# Patient Record
Sex: Female | Born: 1970 | Race: White | Hispanic: No | Marital: Married | Smoking: Former smoker
Health system: Southern US, Community
[De-identification: ages and names within clinical notes are randomized; demographics above are authoritative.]

## PROBLEM LIST (undated history)

## (undated) DIAGNOSIS — F419 Anxiety disorder, unspecified: Secondary | ICD-10-CM

## (undated) DIAGNOSIS — M1990 Unspecified osteoarthritis, unspecified site: Secondary | ICD-10-CM

## (undated) DIAGNOSIS — N83202 Unspecified ovarian cyst, left side: Secondary | ICD-10-CM

## (undated) DIAGNOSIS — L309 Dermatitis, unspecified: Secondary | ICD-10-CM

## (undated) DIAGNOSIS — N62 Hypertrophy of breast: Secondary | ICD-10-CM

## (undated) DIAGNOSIS — M549 Dorsalgia, unspecified: Secondary | ICD-10-CM

## (undated) DIAGNOSIS — E039 Hypothyroidism, unspecified: Secondary | ICD-10-CM

## (undated) DIAGNOSIS — S0300XA Dislocation of jaw, unspecified side, initial encounter: Secondary | ICD-10-CM

## (undated) DIAGNOSIS — J302 Other seasonal allergic rhinitis: Secondary | ICD-10-CM

## (undated) DIAGNOSIS — J189 Pneumonia, unspecified organism: Secondary | ICD-10-CM

## (undated) HISTORY — PX: TEMPOROMANDIBULAR JOINT SURGERY: SHX35

## (undated) HISTORY — PX: REDUCTION MAMMAPLASTY: SUR839

---

## 2001-04-23 ENCOUNTER — Encounter: Admission: RE | Admit: 2001-04-23 | Discharge: 2001-04-23 | Payer: Self-pay | Admitting: Vascular Surgery

## 2001-04-23 ENCOUNTER — Encounter: Payer: Self-pay | Admitting: Vascular Surgery

## 2001-08-06 HISTORY — PX: HEMORROIDECTOMY: SUR656

## 2002-12-02 ENCOUNTER — Other Ambulatory Visit: Admission: RE | Admit: 2002-12-02 | Discharge: 2002-12-02 | Payer: Self-pay | Admitting: Obstetrics & Gynecology

## 2002-12-22 ENCOUNTER — Encounter: Payer: Self-pay | Admitting: Gastroenterology

## 2002-12-22 ENCOUNTER — Encounter: Admission: RE | Admit: 2002-12-22 | Discharge: 2002-12-22 | Payer: Self-pay | Admitting: Gastroenterology

## 2002-12-28 ENCOUNTER — Encounter: Admission: RE | Admit: 2002-12-28 | Discharge: 2002-12-28 | Payer: Self-pay | Admitting: Gastroenterology

## 2002-12-28 ENCOUNTER — Encounter: Payer: Self-pay | Admitting: Gastroenterology

## 2003-01-11 ENCOUNTER — Encounter: Payer: Self-pay | Admitting: Gastroenterology

## 2003-01-11 ENCOUNTER — Encounter: Admission: RE | Admit: 2003-01-11 | Discharge: 2003-01-11 | Payer: Self-pay | Admitting: Gastroenterology

## 2003-02-05 ENCOUNTER — Ambulatory Visit (HOSPITAL_COMMUNITY): Admission: RE | Admit: 2003-02-05 | Discharge: 2003-02-05 | Payer: Self-pay | Admitting: Gastroenterology

## 2003-02-18 ENCOUNTER — Inpatient Hospital Stay (HOSPITAL_COMMUNITY): Admission: RE | Admit: 2003-02-18 | Discharge: 2003-02-20 | Payer: Self-pay | Admitting: Obstetrics & Gynecology

## 2003-02-18 ENCOUNTER — Encounter (INDEPENDENT_AMBULATORY_CARE_PROVIDER_SITE_OTHER): Payer: Self-pay | Admitting: *Deleted

## 2003-02-18 HISTORY — PX: ABDOMINAL HYSTERECTOMY: SHX81

## 2007-04-11 ENCOUNTER — Encounter: Admission: RE | Admit: 2007-04-11 | Discharge: 2007-04-11 | Payer: Self-pay | Admitting: Obstetrics & Gynecology

## 2007-04-28 ENCOUNTER — Encounter: Admission: RE | Admit: 2007-04-28 | Discharge: 2007-04-28 | Payer: Self-pay | Admitting: General Surgery

## 2007-05-08 ENCOUNTER — Encounter: Admission: RE | Admit: 2007-05-08 | Discharge: 2007-05-08 | Payer: Self-pay | Admitting: General Surgery

## 2007-05-30 ENCOUNTER — Ambulatory Visit (HOSPITAL_BASED_OUTPATIENT_CLINIC_OR_DEPARTMENT_OTHER): Admission: RE | Admit: 2007-05-30 | Discharge: 2007-05-30 | Payer: Self-pay | Admitting: General Surgery

## 2007-05-30 HISTORY — PX: BREAST LUMPECTOMY: SHX2

## 2007-09-19 ENCOUNTER — Encounter: Admission: RE | Admit: 2007-09-19 | Discharge: 2007-09-19 | Payer: Self-pay | Admitting: General Surgery

## 2008-03-08 ENCOUNTER — Encounter: Admission: RE | Admit: 2008-03-08 | Discharge: 2008-03-08 | Payer: Self-pay | Admitting: General Surgery

## 2009-06-24 IMAGING — MG MM DUCTOGRAM UNILATERAL *L*
3 series · 3 of 3 positions shown · non-contrast
Comparison: none

Addendum Begins
Addendum
CLINICAL DATA: Right nipple discharge.

On physical exam, today, I elicit milky and green discharge from four separate ducts on the right 
nipple.  This is not suspicious for a malignant breast process and ductography is not indicated.
CLINICAL DATA: Spontaneous left nipple discharge.

[L CC]
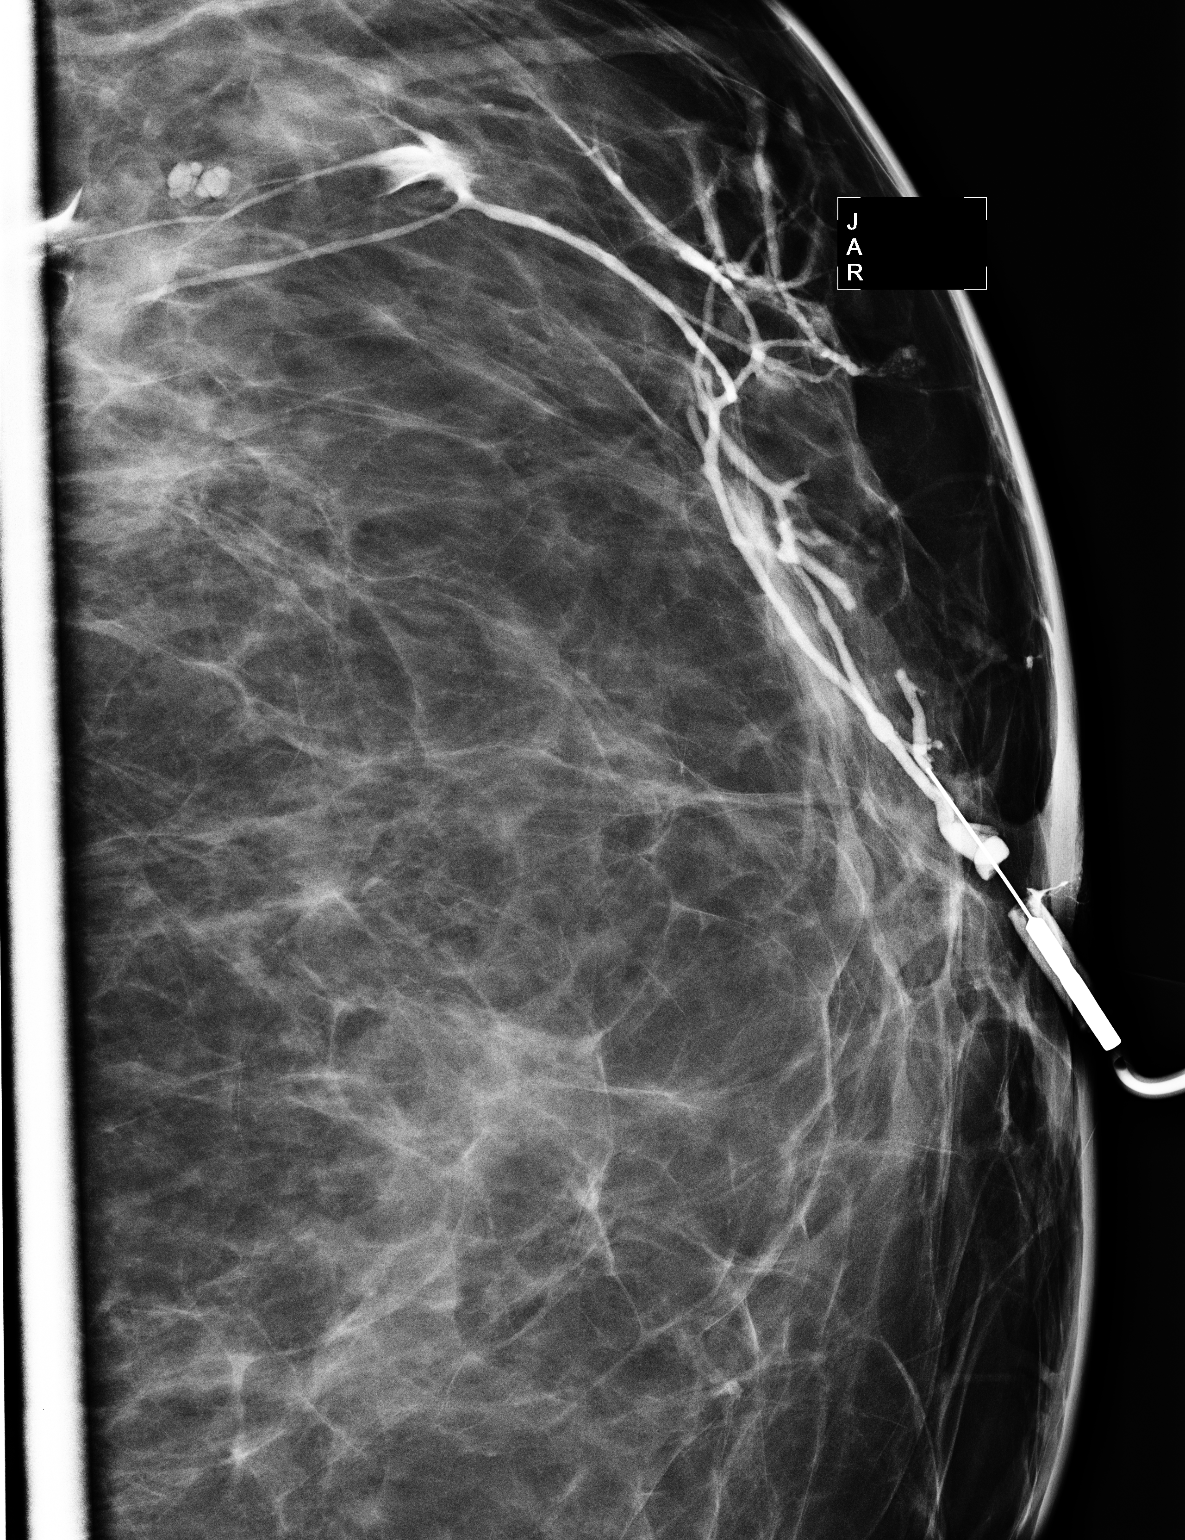

[L ML (1 of 2)]
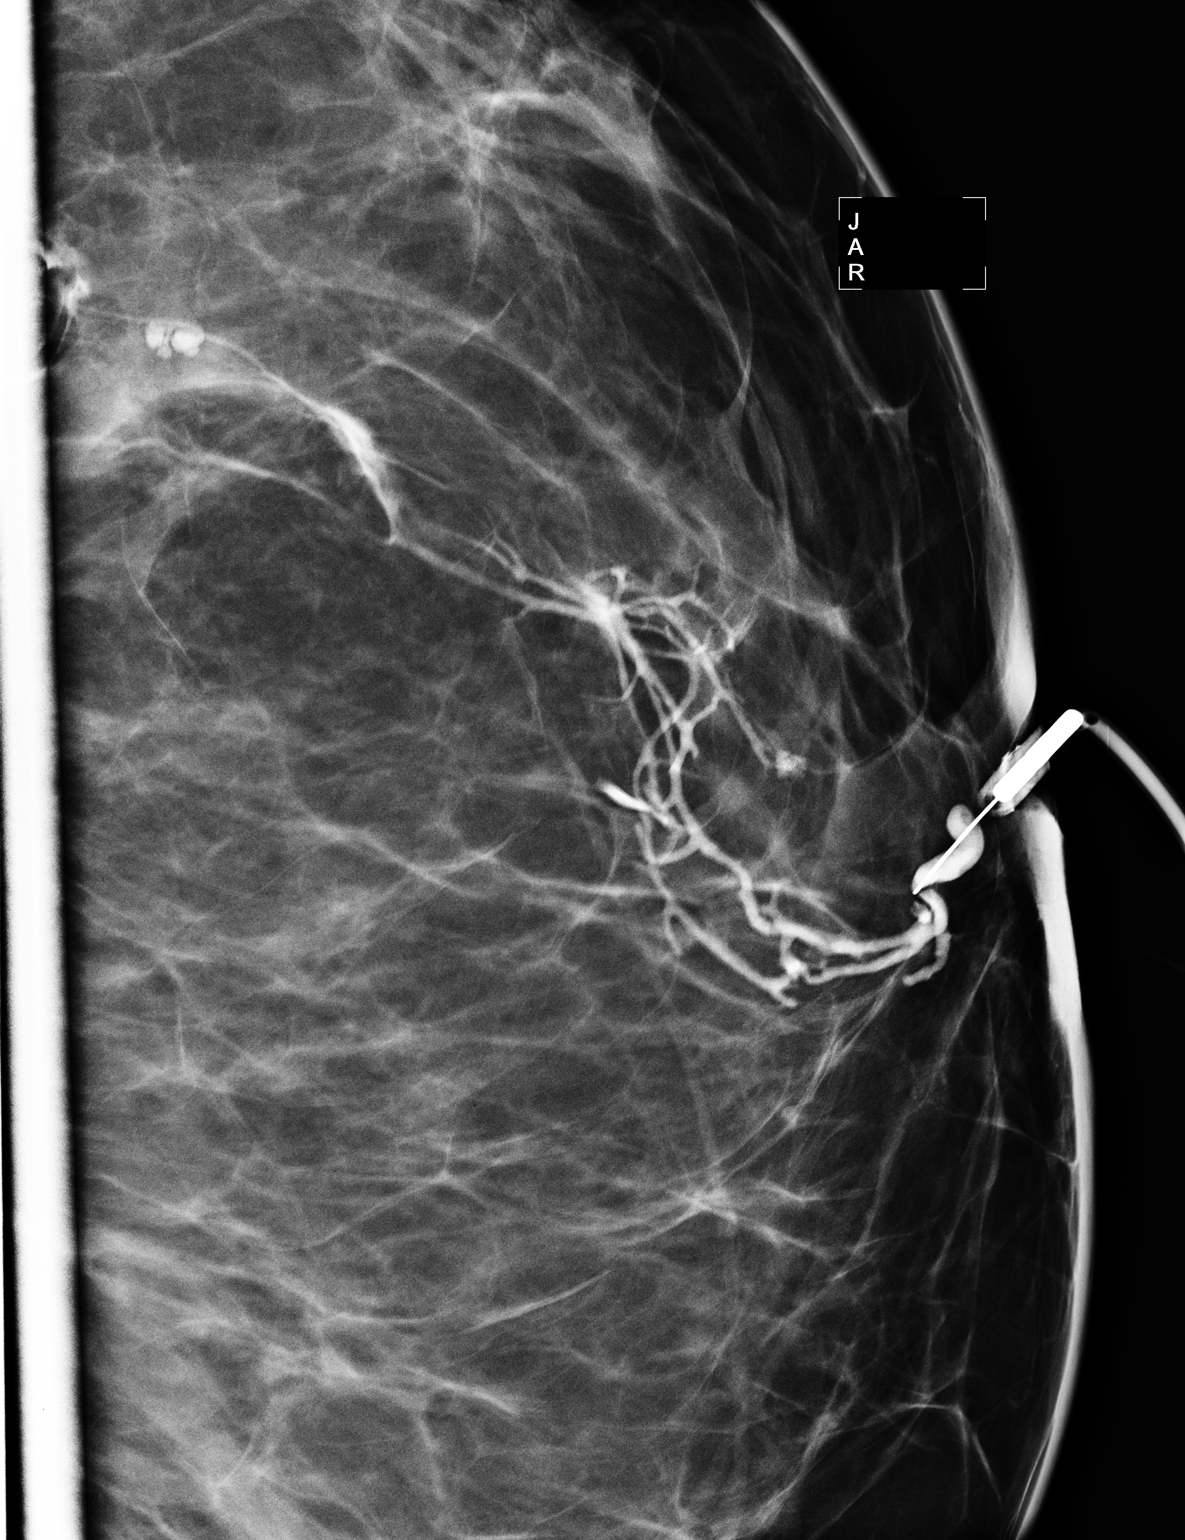

[L ML (2 of 2)]
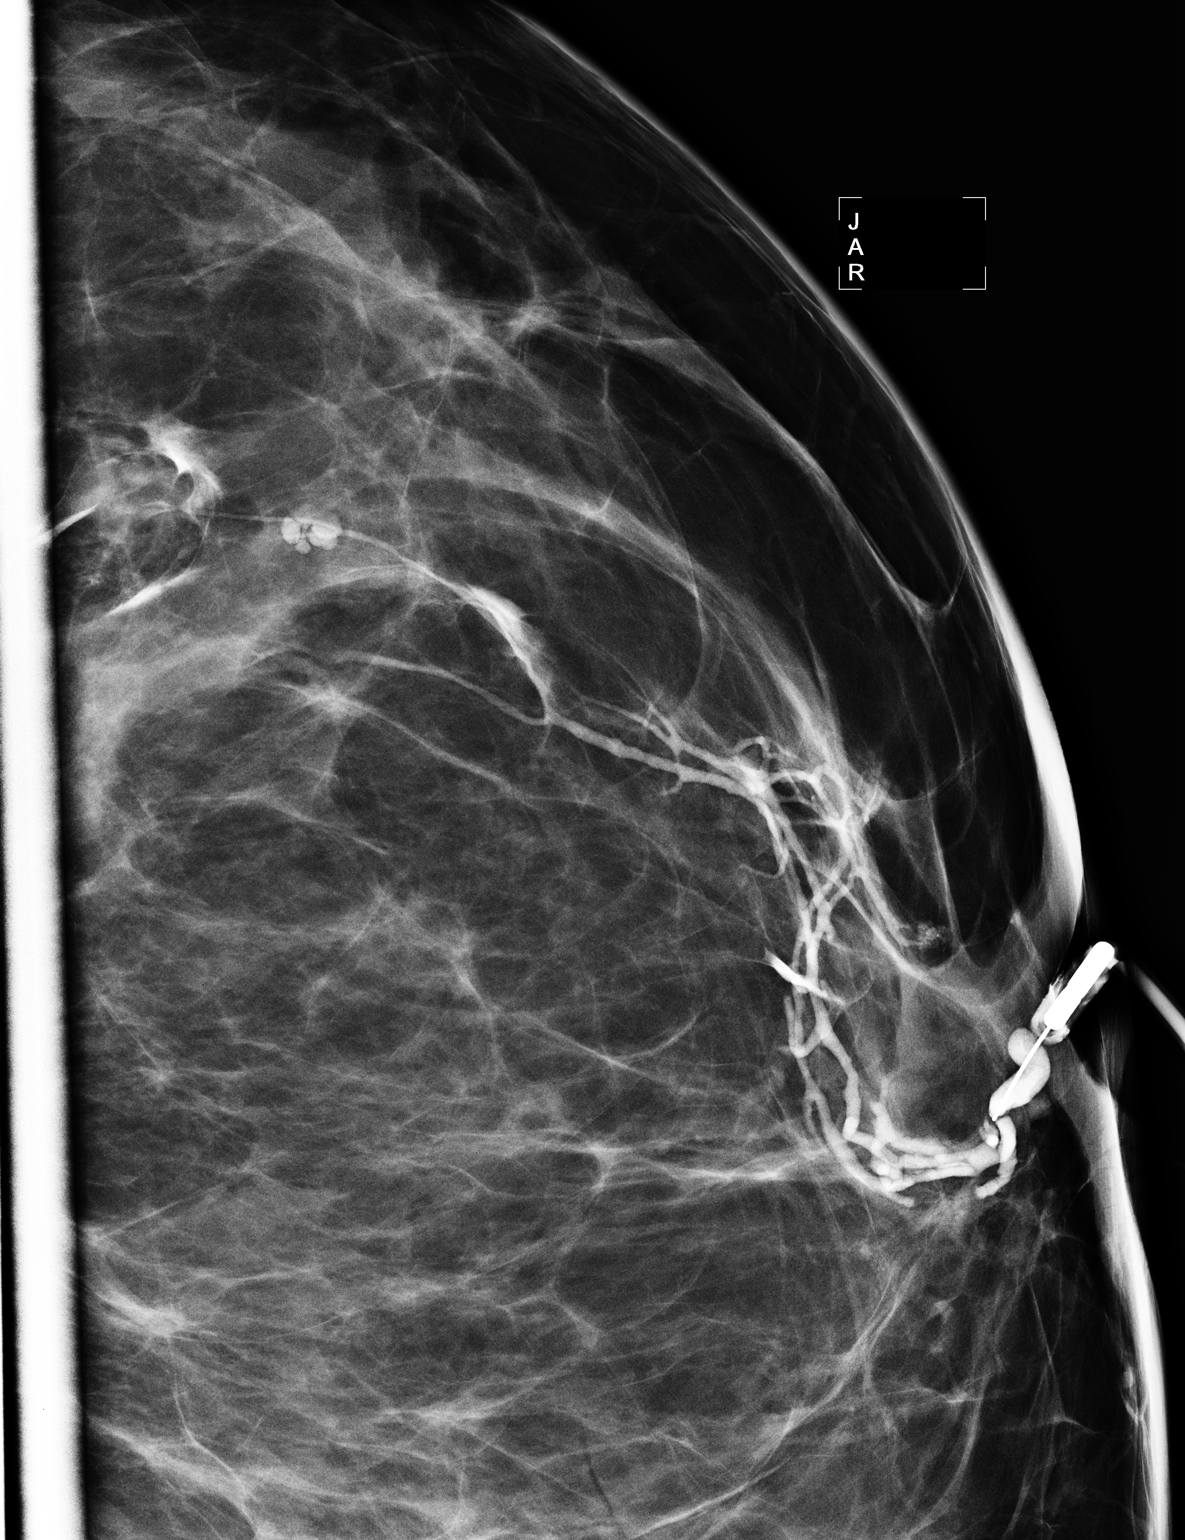

[3 of 3 positions shown; findings below may reference images not displayed]

IMPRESSION: Physiologic nipple discharge on the right.  Follow-up left mammogram in five months will be 
performed as previously recommended.

ASSESSMENT:  Probably benign - BI-RADS 3
, THIS PROCEDURE WAS A DIGITAL MAMMOGRAM.
Addendum Ends
DUCTOGRAM UNILATERAL *L*
CC and MLO view(s) were taken of the left breast.

LEFT BREAST DUCTOGRAM:
A small amount of brownish discharge could be expressed from a single duct on the left.  The nipple
was cleansed with alcohol and the duct was cannulated with a sialogram catheter.  Contrast 
material was instilled.  There is a truncated duct in the subareolar region concerning for a 
papilloma.  There is also evidence of a filling defect directly underneath the  nipple that is also
worrisome for a papilloma.
IMPRESSION: Two intraductal abnormalities concerning for papillomata.  Surgical excision is recommended. The 
patient has a surgical consult with Dr. Maagda Isidor on 04-29-07.

ASSESSMENT: Suspicious - BI-RADS 4

Surgical consultation of the left breast.
, THIS PROCEDURE WAS A DIGITAL MAMMOGRAM.

## 2010-06-06 ENCOUNTER — Encounter: Admission: RE | Admit: 2010-06-06 | Discharge: 2010-06-06 | Payer: Self-pay | Admitting: Obstetrics & Gynecology

## 2010-06-09 ENCOUNTER — Encounter: Admission: RE | Admit: 2010-06-09 | Discharge: 2010-06-09 | Payer: Self-pay | Admitting: Obstetrics & Gynecology

## 2010-08-01 ENCOUNTER — Encounter
Admission: RE | Admit: 2010-08-01 | Discharge: 2010-08-01 | Payer: Self-pay | Source: Home / Self Care | Attending: General Surgery | Admitting: General Surgery

## 2010-08-01 ENCOUNTER — Ambulatory Visit
Admission: RE | Admit: 2010-08-01 | Discharge: 2010-08-01 | Payer: Self-pay | Source: Home / Self Care | Attending: General Surgery | Admitting: General Surgery

## 2010-08-01 HISTORY — PX: BREAST BIOPSY: SHX20

## 2010-08-06 HISTORY — PX: OTHER SURGICAL HISTORY: SHX169

## 2010-08-27 ENCOUNTER — Encounter: Payer: Self-pay | Admitting: General Surgery

## 2010-10-16 LAB — POCT HEMOGLOBIN-HEMACUE: Hemoglobin: 13.8 g/dL (ref 12.0–15.0)

## 2010-12-19 NOTE — Op Note (Signed)
Suzanne Trujillo, MOCCIO             ACCOUNT NO.:  [id number]   MEDICAL RECORD NO.:  [id number]          PATIENT TYPE:  AMB   LOCATION:  DSC                          FACILITY:  [facility]   PHYSICIAN:  Lennie Muckle, MD      DATE OF BIRTH:  06/28/1971   DATE OF PROCEDURE:  05/30/2007  DATE OF DISCHARGE:                               OPERATIVE REPORT   PROCEDURE:  Excision, left breast papilloma.   PREOPERATIVE DIAGNOSIS:  Left breast papilloma.   POSTOPERATIVE DIAGNOSIS:  Left breast papilloma.   SURGEON:  Lennie Muckle, MD   ASSISTANT:  Wilmon Arms. Tsuei, M.D.   INDICATIONS FOR PROCEDURE:  Ms. Flaim is a 40 year old female who had  drainage of brownish fluid from her left breast, was found to have a  filling defect which is consistent with a papilloma.  A ductogram was  performed.  The filling defect was confirmed on a ductogram.  The  informed consent was obtained prior to procedure.   DESCRIPTION OF PROCEDURE:  Ms. Binz was identified in the preoperative  holding suite.  She did receive preoperative antibiotics of gentamicin.  The appropriate side of the body was marked.  She was taken to the  operating suite where she was placed in a supine position.  After  administration of a general endotracheal anesthesia, at time out was  performed to identify the patient and the procedure.  Her left breast  was prepped and draped in the usual sterile fashion.  Upon review of the  ductogram, the lesion was believed to be inferior and lateral on the  left breast.  Prior to incision, the area was attempted to be dilated  with a small probe.  An Angiocath was introduced into the area that was  believed to be the duct and approximately 2 mL of methylene blue was  injected into the breast tissue. A lateral incision was made in the  periareolar area.  Electrocautery was used to dissect just beneath the  nipple.  After incision, the methylene blue was identified just beneath  the areola, however,  it did not track into a clear ductal system.  Based  on the films, I then performed excision of the tissue which was lateral  and inferior to the nipple areolar complex.  I did go approximately 6 cm  in depth into the tissues with excision of the breast tissue.  The  anterior portion is marked with a double suture and lateral with a long  lateral suture and the short single suture marked the superior portion  of the margin of breast tissue.  The area was irrigated.  Hemostasis  maintained with electrocautery.  The wound bed was then closed with 3-0  Vicryl suture and 4-0 Monocryl, 25 mL of 0.25% Marcaine with epinephrine  1% lidocaine were injected into the tissues to fill and also filled the  defect from excision of the breast tissue.  Steri-Strips and dry gauze  were placed for dressing.  The patient was then wrapped in an Ace wrap  and extubated, transported to post-anesthesia care unit in stable  condition.  Estimated blood loss was minimal, no immediate  complications.  Specimen sent to pathology for review.     Lennie Muckle, MD  Electronically Signed    ALA/MEDQ  D:  05/30/2007  T:  05/30/2007  Job:  [id number]

## 2010-12-22 NOTE — Discharge Summary (Signed)
NAMEBIONCA, Suzanne Trujillo                       ACCOUNT NO.:  [id number]   MEDICAL RECORD NO.:  [id number]                   PATIENT TYPE:  INP   LOCATION:  9327                                 [facility]   PHYSICIAN:  Gerrit Friends. Aldona Bar, M.D.                DATE OF BIRTH:  19-Jul-1971   DATE OF ADMISSION:  02/18/2003  DATE OF DISCHARGE:  02/20/2003                                 DISCHARGE SUMMARY   DISCHARGE DIAGNOSES:  1. 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  2. Mild cervical dysplasia.   PROCEDURE:  Exploratory laparotomy and lysis of adhesions, total abdominal  hysterectomy, right salpingo-oophorectomy.   HISTORY OF PRESENT ILLNESS:  This 40 year old female was admitted for a  total abdominal hysterectomy and at least a right salpingo-oophorectomy with  a history of chronic pelvic abdominal pain felt to be related to either  adhesions or endometriosis.  She had a thorough GI workup including a  colonoscopy and other than a chronic history of irritable bowel syndrome, no  abnormalities were found.  She did have a previous cesarean section and it  was felt that possibly her symptoms were consistent with either  endometriosis/adenomyosis or adhesions.   HOSPITAL COURSE:  Intraoperatively, it was discovered that she had dense  adhesions involving the bladder which was well advanced onto the anterior  surface of the uterus as well as omental adhesions.  The adhesions were  taken down in the hysterectomy and right salpingo-oophorectomy was carried  out without difficulty.  The dissection was somewhat difficult, especially  in the area of the patient's cervix.   Her preoperative hemoglobin was 12.6 with a white count of 5800 and her  postoperative hemoglobin was 11.8 with a white count of 9600.  She remained  afebrile during her postoperative course and had excellent bladder function  likewise during her postoperative course.  She also related the chronic  pain  symptoms were essentially much improved postoperatively and in fact said  that her only discomfort was that associated with the actual surgical  procedure and was nowhere in comparison to the pain that she was having  preoperatively.   On the afternoon of July 17, having had normal bowel and bladder functions,  and having been afebrile and being that she was tolerating her regular diet  well, she was offered discharge and accepted.  At the time of discharge her  staples were clean and dry and her staples were left in and she will return  to the office on Monday, July 19, for staple removal and Steri-Stripping of  her incision.  She was given all careful instructions at the time of  discharge and understood all instructions well.   DISCHARGE MEDICATIONS:  1. Tylox 1-2 every 4-6 hours as needed for pain.  2. Motrin 600 mg every six hours as needed for pain.  3. Zelnorm 6 mg twice daily.  4. She  will use over the counter medications as needed for constipation     and/or gas.   FOLLOW UP:  Arranged as above for staple removal.   CONDITION ON DISCHARGE:  Improved.   Her pathologic report did reveal as mentioned no evidence of adenomyosis or  endometriosis.  There was some mild dysplasia of her cervix noted, although  her previous Pap smear was reported as normal.  In summary I would assume  that the taking down of the bladder off the anterior wall of the  uterus/adhesiolysis may have indeed resolved her chronic pain issues.                                                 Gerrit Friends. Aldona Bar, M.D.    RMW/MEDQ  D:  02/20/2003  T:  02/21/2003  Job:  [id number]   cc:   Gerrit Friends. Aldona Bar, M.D.  [address]  [place]  [place] [address]  Fax: [phone]   Graylin Shiver, M.D.  [phone]  [address]  [place], [place] [address]  Fax: [phone]

## 2010-12-22 NOTE — H&P (Signed)
[name], [name]                       ACCOUNT NO.:  [id number]   MEDICAL RECORD NO.:  [id number]                   PATIENT TYPE:  INP   LOCATION:  NA                                   [facility]   PHYSICIAN:  Gerrit Friends. Aldona Bar, M.D.                DATE OF BIRTH:  01-24-1971   DATE OF ADMISSION:  02/18/2003  DATE OF DISCHARGE:                                HISTORY & PHYSICAL   HISTORY OF PRESENT ILLNESS:  Suzanne Trujillo is a gravida 4, para 2, aborta  6, 40 year old married white female, with a history of chronic low abdominal  pain, pelvic pressure, chronic right lower quadrant pain, who is admitted  for a total abdominal hysterectomy, right salpingo-oophorectomy, possible  lysis of adhesions, possible left salpingo-oophorectomy, and possible  appendectomy.  This patient was seen by me for the first time in late April  of 2004.  She gave a history of irritable bowel syndrome, but a more  significant history of right lower quadrant pain for several months  associated with no period since November of 2003.  She had her two  deliveries by cesarean section.  She had had an ectopic pregnancy in 1994  which was treated with medication.  She has also had one miscarriage.  When  I examined her on April 28, her uterus was tender.  No masses were palpable  and the uterus was felt to be upper limits of normal size.  A trial of oral  contraceptives was carried out, but with no results.  She had no period.  She did undergo lab testing which revealed a normal thyroid and normal blood  count, a normal Pap smear, and ultimately in June of 2004, had a normal FSH  level and a normal CA-125.  Because some of her symptomatology seemed to be  gastrointestinal in etiology as well, she was referred to a  gastroenterologist for a thorough workup, which ultimately included a  colonoscopy.  CAT scan was also done.  Essentially her GI workup was  negative with the exception of her history of  irritable bowel syndrome, and  for that she is currently on Zelnorm 6 mg twice daily.  Her discomfort is  significant enough to require Darvocet-N and in the evenings, Vicodin.   After much discussion with the patient, especially after her GI workup was  completed, a decision was made to proceed with a total abdominal  hysterectomy with the possible expectation of uncovering adhesions or  endometriosis.  Because her ectopic pregnancy was located in her right tube,  and treated with presumably methotrexate, she will also undergo a right  salpingo-oophorectomy, since this is where the predominant amount of her  abdominal discomfort tends to be centralized.  The appendix will also be  inspected carefully and removed if necessary, and likewise, the left tube  and ovary will be inspected carefully and removed if necessary.  I had  a  long talk with the patient and her husband, and both understand the  expectations from the surgery, are relief of her low abdominal discomfort,  but unfortunately, the surgery is not necessarily a guarantee that her  discomfort will be improved upon.   The risks and benefits and expectations of her surgery were discussed and  understood well by the patient and her husband.   PAST MEDICAL HISTORY:   ALLERGIES:  The patient is allergic to PENICILLIN.   PAST SURGICAL HISTORY:  Other than her two cesarean sections, she has had no  other surgery.   OTHER MEDICATIONS:  1. Zoloft 50 mg a day.  2. Zelnorm as mentioned above.  3. P.r.n. Darvocet-N.  4. Usually a Vicodin at bedtime.   SOCIAL HISTORY:  Noncontributory.   FAMILY HISTORY:  Noncontributory.   PHYSICAL EXAMINATION:  GENERAL:  Examination at the time of admission finds  a well-developed female in no acute distress.  VITAL SIGNS:  Temperature 98.2, blood pressure 110/70, weight 154,  respirations 18 and regular, pulse 80 and regular.  HEENT:  Negative.  NECK:  Thyroid not enlarged.  CHEST:  Clear.   CARDIOVASCULAR:  Regular rhythm, no murmur.  BREASTS:  Negative.  ABDOMEN:  There is deep tenderness in the lower abdomen.  There is no  rebound and no organomegaly.  No CVA tenderness.  PELVIC:  External genitalia and BUS normal.  Shortness by digital vault well-  supported.  Cervix:  No gross lesions.  Uterus:  Upper limits of normal  size.  Relatively thick.  Adnexal area:  No obvious masses, although there  is tenderness in the right adnexal area.  Rectovaginal confirmatory.  EXTREMITIES:  Negative.  NEUROLOGIC:  Physiologic.   IMPRESSION:  Chronic lower abdominal and pelvic pain - suspect possible  endometriosis - possible adhesions - no further childbearing.   PLAN:  The patient had a thorough GI workup which was negative, although she  does have a history of irritable bowel syndrome and is on medication for  that.  She will undergo a total abdominal hysterectomy, right salpingo-  oophorectomy, possible left salpingo-oophorectomy, and possible  appendectomy.                                                  Gerrit Friends. Aldona Bar, M.D.    RMW/MEDQ  D:  02/17/2003  T:  02/17/2003  Job:  [id number]

## 2011-05-16 LAB — POCT HEMOGLOBIN-HEMACUE
Hemoglobin: 14.2
Operator id: 128471

## 2011-10-05 DIAGNOSIS — N62 Hypertrophy of breast: Secondary | ICD-10-CM

## 2011-10-05 HISTORY — DX: Hypertrophy of breast: N62

## 2011-10-25 ENCOUNTER — Encounter (HOSPITAL_BASED_OUTPATIENT_CLINIC_OR_DEPARTMENT_OTHER): Payer: Self-pay | Admitting: *Deleted

## 2011-10-25 DIAGNOSIS — L309 Dermatitis, unspecified: Secondary | ICD-10-CM

## 2011-10-25 HISTORY — DX: Dermatitis, unspecified: L30.9

## 2011-10-31 ENCOUNTER — Encounter (HOSPITAL_BASED_OUTPATIENT_CLINIC_OR_DEPARTMENT_OTHER): Payer: Self-pay | Admitting: Anesthesiology

## 2011-10-31 ENCOUNTER — Ambulatory Visit (HOSPITAL_BASED_OUTPATIENT_CLINIC_OR_DEPARTMENT_OTHER): Payer: Managed Care, Other (non HMO) | Admitting: Anesthesiology

## 2011-10-31 ENCOUNTER — Ambulatory Visit (HOSPITAL_BASED_OUTPATIENT_CLINIC_OR_DEPARTMENT_OTHER)
Admission: RE | Admit: 2011-10-31 | Discharge: 2011-10-31 | Disposition: A | Discharge status: Home / Self Care | Payer: Managed Care, Other (non HMO) | Source: Ambulatory Visit | Attending: Plastic Surgery | Admitting: Plastic Surgery

## 2011-10-31 ENCOUNTER — Encounter (HOSPITAL_BASED_OUTPATIENT_CLINIC_OR_DEPARTMENT_OTHER)
Admission: RE | Disposition: A | Discharge status: Home / Self Care | Payer: Self-pay | Source: Ambulatory Visit | Attending: Plastic Surgery

## 2011-10-31 DIAGNOSIS — N62 Hypertrophy of breast: Secondary | ICD-10-CM | POA: Insufficient documentation

## 2011-10-31 DIAGNOSIS — E039 Hypothyroidism, unspecified: Secondary | ICD-10-CM | POA: Insufficient documentation

## 2011-10-31 HISTORY — DX: Hypothyroidism, unspecified: E03.9

## 2011-10-31 HISTORY — PX: BREAST REDUCTION SURGERY: SHX8

## 2011-10-31 HISTORY — DX: Dermatitis, unspecified: L30.9

## 2011-10-31 HISTORY — DX: Hypertrophy of breast: N62

## 2011-10-31 HISTORY — DX: Other seasonal allergic rhinitis: J30.2

## 2011-10-31 HISTORY — DX: Dislocation of jaw, unspecified side, initial encounter: S03.00XA

## 2011-10-31 LAB — POCT HEMOGLOBIN-HEMACUE: Hemoglobin: 12.3 g/dL (ref 12.0–15.0)

## 2011-10-31 SURGERY — MAMMOPLASTY, REDUCTION
Anesthesia: General | Site: Breast | Laterality: Bilateral | Wound class: Clean

## 2011-10-31 MED ORDER — EPHEDRINE SULFATE 50 MG/ML IJ SOLN
INTRAMUSCULAR | Status: DC | PRN
  Administered 2011-10-31: 10 mg via INTRAVENOUS

## 2011-10-31 MED ORDER — SUCCINYLCHOLINE CHLORIDE 20 MG/ML IJ SOLN
INTRAMUSCULAR | Status: DC | PRN
  Administered 2011-10-31: 100 mg via INTRAVENOUS

## 2011-10-31 MED ORDER — ONDANSETRON HCL 4 MG/2ML IJ SOLN
INTRAMUSCULAR | Status: DC | PRN
  Administered 2011-10-31: 4 mg via INTRAVENOUS

## 2011-10-31 MED ORDER — MIDAZOLAM HCL 2 MG/2ML IJ SOLN: 1.0000 mg | INTRAMUSCULAR | Status: DC | PRN

## 2011-10-31 MED ORDER — HYDROMORPHONE HCL PF 1 MG/ML IJ SOLN
0.2500 mg | INTRAMUSCULAR | Status: DC | PRN
  Administered 2011-10-31 (×2): 0.5 mg via INTRAVENOUS

## 2011-10-31 MED ORDER — FENTANYL CITRATE 0.05 MG/ML IJ SOLN: 50.0000 ug | INTRAMUSCULAR | Status: DC | PRN

## 2011-10-31 MED ORDER — BUPIVACAINE-EPINEPHRINE 0.5% -1:200000 IJ SOLN
INTRAMUSCULAR | Status: DC | PRN
  Administered 2011-10-31 (×2): 20 mL

## 2011-10-31 MED ORDER — LIDOCAINE-EPINEPHRINE 1 %-1:100000 IJ SOLN
INTRAMUSCULAR | Status: DC | PRN
  Administered 2011-10-31: 40 mL

## 2011-10-31 MED ORDER — PROMETHAZINE HCL 12.5 MG RE SUPP
12.5000 mg | Freq: Once | RECTAL | Status: AC
  Administered 2011-10-31: 12.5 mg via RECTAL

## 2011-10-31 MED ORDER — LORAZEPAM 2 MG/ML IJ SOLN: 1.0000 mg | Freq: Once | INTRAMUSCULAR | Status: DC | PRN

## 2011-10-31 MED ORDER — MIDAZOLAM HCL 5 MG/5ML IJ SOLN
INTRAMUSCULAR | Status: DC | PRN
  Administered 2011-10-31: 2 mg via INTRAVENOUS

## 2011-10-31 MED ORDER — CEFAZOLIN SODIUM 1-5 GM-% IV SOLN
1.0000 g | Freq: Once | INTRAVENOUS | Status: AC
  Administered 2011-10-31: 1 g via INTRAVENOUS

## 2011-10-31 MED ORDER — FENTANYL CITRATE 0.05 MG/ML IJ SOLN
INTRAMUSCULAR | Status: DC | PRN
  Administered 2011-10-31: 100 ug via INTRAVENOUS
  Administered 2011-10-31: 25 ug via INTRAVENOUS
  Administered 2011-10-31: 100 ug via INTRAVENOUS
  Administered 2011-10-31: 25 ug via INTRAVENOUS
  Administered 2011-10-31: 50 ug via INTRAVENOUS
  Administered 2011-10-31 (×4): 25 ug via INTRAVENOUS

## 2011-10-31 MED ORDER — BACITRACIN ZINC 500 UNIT/GM EX OINT
TOPICAL_OINTMENT | CUTANEOUS | Status: DC | PRN
  Administered 2011-10-31: 2 via TOPICAL

## 2011-10-31 MED ORDER — DEXAMETHASONE SODIUM PHOSPHATE 10 MG/ML IJ SOLN
INTRAMUSCULAR | Status: DC | PRN
  Administered 2011-10-31: 10 mg via INTRAVENOUS

## 2011-10-31 MED ORDER — 0.9 % SODIUM CHLORIDE (POUR BTL) OPTIME
TOPICAL | Status: DC | PRN
  Administered 2011-10-31 (×2): 450 mL

## 2011-10-31 MED ORDER — LACTATED RINGERS IV SOLN
INTRAVENOUS | Status: DC
  Administered 2011-10-31: 10 mL/h via INTRAVENOUS
  Administered 2011-10-31 (×4): via INTRAVENOUS

## 2011-10-31 MED ORDER — PROPOFOL 10 MG/ML IV BOLUS
INTRAVENOUS | Status: DC | PRN
  Administered 2011-10-31: 200 mg via INTRAVENOUS
  Administered 2011-10-31: 10 mg via INTRAVENOUS

## 2011-10-31 MED ORDER — LIDOCAINE HCL (CARDIAC) 20 MG/ML IV SOLN
INTRAVENOUS | Status: DC | PRN
  Administered 2011-10-31: 100 mg via INTRAVENOUS

## 2011-10-31 MED ORDER — DROPERIDOL 2.5 MG/ML IJ SOLN
INTRAMUSCULAR | Status: DC | PRN
  Administered 2011-10-31: 0.625 mg via INTRAVENOUS

## 2011-10-31 SURGICAL SUPPLY — 56 items
BANDAGE GAUZE ELAST BULKY 4 IN (GAUZE/BANDAGES/DRESSINGS) ×4 IMPLANT
BENZOIN TINCTURE PRP APPL 2/3 (GAUZE/BANDAGES/DRESSINGS) ×4 IMPLANT
BLADE KNIFE PERSONA 10 (BLADE) ×8 IMPLANT
BLADE KNIFE PERSONA 15 (BLADE) ×6 IMPLANT
CANISTER SUCTION 1200CC (MISCELLANEOUS) ×4 IMPLANT
CAP BOUFFANT 24 BLUE NURSES (PROTECTIVE WEAR) ×2 IMPLANT
CLOTH BEACON ORANGE TIMEOUT ST (SAFETY) ×2 IMPLANT
COVER MAYO STAND STRL (DRAPES) ×2 IMPLANT
COVER TABLE BACK 60X90 (DRAPES) ×2 IMPLANT
DECANTER SPIKE VIAL GLASS SM (MISCELLANEOUS) ×4 IMPLANT
DRAIN CHANNEL 10F 3/8 F FF (DRAIN) ×4 IMPLANT
DRAPE LAPAROSCOPIC ABDOMINAL (DRAPES) ×2 IMPLANT
DRSG EMULSION OIL 3X3 NADH (GAUZE/BANDAGES/DRESSINGS) ×4 IMPLANT
DRSG PAD ABDOMINAL 8X10 ST (GAUZE/BANDAGES/DRESSINGS) ×4 IMPLANT
ELECT NEEDLE TIP 2.8 STRL (NEEDLE) IMPLANT
ELECT REM PT RETURN 9FT ADLT (ELECTROSURGICAL) ×2
ELECTRODE REM PT RTRN 9FT ADLT (ELECTROSURGICAL) ×1 IMPLANT
EVACUATOR SILICONE 100CC (DRAIN) ×4 IMPLANT
FILTER 7/8 IN (FILTER) ×2 IMPLANT
GLOVE BIO SURGEON STRL SZ 6.5 (GLOVE) ×6 IMPLANT
GLOVE BIOGEL PI IND STRL 6.5 (GLOVE) IMPLANT
GLOVE BIOGEL PI INDICATOR 6.5 (GLOVE)
GLOVE ECLIPSE 6.5 STRL STRAW (GLOVE) ×2 IMPLANT
GOWN PREVENTION PLUS XLARGE (GOWN DISPOSABLE) ×4 IMPLANT
GOWN PREVENTION PLUS XXLARGE (GOWN DISPOSABLE) IMPLANT
NEEDLE HYPO 25X1 1.5 SAFETY (NEEDLE) ×2 IMPLANT
NEEDLE SPNL 18GX3.5 QUINCKE PK (NEEDLE) ×2 IMPLANT
NS IRRIG 1000ML POUR BTL (IV SOLUTION) ×4 IMPLANT
PACK BASIN DAY SURGERY FS (CUSTOM PROCEDURE TRAY) ×2 IMPLANT
PIN SAFETY STERILE (MISCELLANEOUS) ×2 IMPLANT
SCRUB PCMX 4 OZ (MISCELLANEOUS) ×2 IMPLANT
SLEEVE SCD COMPRESS KNEE MED (MISCELLANEOUS) ×2 IMPLANT
SPECIMEN JAR MEDIUM (MISCELLANEOUS) ×4 IMPLANT
SPECIMEN JAR X LARGE (MISCELLANEOUS) IMPLANT
SPONGE GAUZE 4X4 12PLY (GAUZE/BANDAGES/DRESSINGS) ×4 IMPLANT
SPONGE LAP 18X18 X RAY DECT (DISPOSABLE) ×6 IMPLANT
STAPLER VISISTAT 35W (STAPLE) ×4 IMPLANT
STRIP CLOSURE SKIN 1/2X4 (GAUZE/BANDAGES/DRESSINGS) ×8 IMPLANT
SUT ETHILON 3 0 PS 1 (SUTURE) ×2 IMPLANT
SUT MNCRL AB 3-0 PS2 18 (SUTURE) ×8 IMPLANT
SUT MNCRL AB 4-0 PS2 18 (SUTURE) ×4 IMPLANT
SUT MON AB 5-0 PS2 18 (SUTURE) ×4 IMPLANT
SUT PROLENE 2 0 CT2 30 (SUTURE) ×2 IMPLANT
SUT PROLENE 3 0 PS 1 (SUTURE) ×4 IMPLANT
SUT QUILL PDO 2-0 (SUTURE) ×4 IMPLANT
SYR BULB IRRIGATION 50ML (SYRINGE) ×4 IMPLANT
SYR CONTROL 10ML LL (SYRINGE) ×6 IMPLANT
TOWEL OR 17X24 6PK STRL BLUE (TOWEL DISPOSABLE) ×6 IMPLANT
TOWEL OR NON WOVEN STRL DISP B (DISPOSABLE) ×2 IMPLANT
TRAY DSU PREP LF (CUSTOM PROCEDURE TRAY) ×2 IMPLANT
TRAY FOLEY CATH 14FR (SET/KITS/TRAYS/PACK) ×2 IMPLANT
TUBE CONNECTING 20X1/4 (TUBING) ×2 IMPLANT
UNDERPAD 30X30 INCONTINENT (UNDERPADS AND DIAPERS) ×4 IMPLANT
VAC PENCILS W/TUBING CLEAR (MISCELLANEOUS) ×2 IMPLANT
WATER STERILE IRR 1000ML POUR (IV SOLUTION) ×4 IMPLANT
YANKAUER SUCT BULB TIP NO VENT (SUCTIONS) ×2 IMPLANT

## 2011-10-31 NOTE — Discharge Instructions (Signed)
About my Jackson-Pratt Bulb Drain  What is a Jackson-Pratt bulb? A Jackson-Pratt is a soft, round device used to collect drainage. It is connected to a long, thin drainage catheter, which is held in place by one or two small stiches near your surgical incision site. When the bulb is squeezed, it forms a vacuum, forcing the drainage to empty into the bulb.  Emptying the Jackson-Pratt bulb- To empty the bulb: 1. Release the plug on the top of the bulb. 2. Pour the bulb's contents into a measuring container which your nurse will provide. 3. Record the time emptied and amount of drainage. Empty the drain(s) as often as your     doctor or nurse recommends.  Date                  Time                    Amount (Drain 1)                 Amount (Drain 2)  _____________________________________________________________________  _____________________________________________________________________  _____________________________________________________________________  _____________________________________________________________________  _____________________________________________________________________  _____________________________________________________________________  _____________________________________________________________________  _____________________________________________________________________  Squeezing the Jackson-Pratt Bulb- To squeeze the bulb: 1. Make sure the plug at the top of the bulb is open. 2. Squeeze the bulb tightly in your fist. You will hear air squeezing from the bulb. 3. Replace the plug while the bulb is squeezed. 4. Use a safety pin to attach the bulb to your clothing. This will keep the catheter from     pulling at the bulb insertion site.  When to call your doctor- Call your doctor if:  Drain site becomes red, swollen or hot.  You have a fever greater than 101 degrees F.  There is oozing at the drain site.  Drain falls  out (apply a guaze bandage over the drain hole and secure it with tape).  Drainage increases daily not related to activity patterns. (You will usually have more drainage when you are active than when you are resting.)  Drainage has a bad odor.     [facility]  [address] [place], [place] [address] [phone]   Post Anesthesia Home Care Instructions  Activity: Get plenty of rest for the remainder of the day. A responsible adult should stay with you for 24 hours following the procedure.  For the next 24 hours, DO NOT: -Drive a car -Advertising copywriter -Drink alcoholic beverages -Take any medication unless instructed by your physician -Make any legal decisions or sign important papers.  Meals: Start with liquid foods such as gelatin or soup. Progress to regular foods as tolerated. Avoid greasy, spicy, heavy foods. If nausea and/or vomiting occur, drink only clear liquids until the nausea and/or vomiting subsides. Call your physician if vomiting continues.  Special Instructions/Symptoms: Your throat may feel dry or sore from the anesthesia or the breathing tube placed in your throat during surgery. If this causes discomfort, gargle with warm salt water. The discomfort should disappear within 24 hours.     Dr. Sherald Hess Instructions:  1. No lifting, pushing or pulling greater than 5 lbs with arms for 4 weeks. 2. Empty, strip, record and reactivate JP drains three times a day. 3. Follow-up appointment Friday in office. 4. Percocet 5/325 tabs 1-2 tabs po q 4-6 hours prn pain (prescription given in office). 5. Duricef 500 mg tabs 1 tab po q 12 hours (prescription  given in office). 6. Sterapred dose pack as directed (prescription given in office).

## 2011-10-31 NOTE — Anesthesia Procedure Notes (Signed)
Procedure Name: Intubation Date/Time: 10/31/2011 9:11 AM Performed by: Gar Gibbon Pre-anesthesia Checklist: Patient identified, Emergency Drugs available, Suction available and Patient being monitored Patient Re-evaluated:Patient Re-evaluated prior to inductionOxygen Delivery Method: Circle system utilized Preoxygenation: Pre-oxygenation with 100% oxygen Intubation Type: IV induction Ventilation: Mask ventilation without difficulty Laryngoscope Size: Miller and 2 Grade View: Grade I Tube type: Oral Number of attempts: 1 Placement Confirmation: ETT inserted through vocal cords under direct vision,  positive ETCO2 and breath sounds checked- equal and bilateral Secured at: 21 cm Tube secured with: Tape Dental Injury: Teeth and Oropharynx as per pre-operative assessment

## 2011-10-31 NOTE — Transfer of Care (Signed)
Immediate Anesthesia Transfer of Care Note  Patient: Suzanne Trujillo  Procedure(s) Performed: Procedure(s) (LRB): MAMMARY REDUCTION  (BREAST) (Bilateral)  Patient Location: PACU  Anesthesia Type: General  Level of Consciousness: sedated  Airway & Oxygen Therapy: Patient Spontanous Breathing and Patient connected to face mask oxygen  Post-op Assessment: Report given to PACU RN and Post -op Vital signs reviewed and stable  Post vital signs: Reviewed and stable  Complications: No apparent anesthesia complications

## 2011-10-31 NOTE — Anesthesia Preprocedure Evaluation (Signed)
Anesthesia Evaluation  Patient identified by MRN, date of birth, ID band Patient awake    Reviewed: Allergy & Precautions, H&P , NPO status , Patient's Chart, lab work & pertinent test results  Airway Mallampati: I TM Distance: >3 FB Neck ROM: Full    Dental   Pulmonary    Pulmonary exam normal       Cardiovascular     Neuro/Psych    GI/Hepatic   Endo/Other  Hypothyroidism   Renal/GU      Musculoskeletal   Abdominal   Peds  Hematology   Anesthesia Other Findings   Reproductive/Obstetrics                           Anesthesia Physical Anesthesia Plan  ASA: I  Anesthesia Plan: General   Post-op Pain Management:    Induction: Intravenous  Airway Management Planned: Oral ETT  Additional Equipment:   Intra-op Plan:   Post-operative Plan: Extubation in OR  Informed Consent: I have reviewed the patients History and Physical, chart, labs and discussed the procedure including the risks, benefits and alternatives for the proposed anesthesia with the patient or authorized representative who has indicated his/her understanding and acceptance.     Plan Discussed with: CRNA and Surgeon  Anesthesia Plan Comments:         Anesthesia Quick Evaluation

## 2011-10-31 NOTE — Anesthesia Postprocedure Evaluation (Signed)
  Anesthesia Post-op Note  Patient: Suzanne Trujillo  Procedure(s) Performed: Procedure(s) (LRB): MAMMARY REDUCTION  (BREAST) (Bilateral)  Patient Location: PACU  Anesthesia Type: General  Level of Consciousness: awake  Airway and Oxygen Therapy: Patient Spontanous Breathing  Post-op Pain: mild  Post-op Assessment: Post-op Vital signs reviewed, Patient's Cardiovascular Status Stable, Respiratory Function Stable, Patent Airway and Pain level controlled  Post-op Vital Signs: stable  Complications: No apparent anesthesia complications

## 2011-10-31 NOTE — Brief Op Note (Signed)
10/31/2011  12:28 PM  PATIENT:  Suzanne Trujillo  41 y.o. female  PRE-OPERATIVE DIAGNOSIS:  macromastia  POST-OPERATIVE DIAGNOSIS:  macromastia  PROCEDURE:  Procedure(s) (LRB): MAMMARY REDUCTION  (BREAST) (Bilateral)  SURGEON:  Surgeon(s) and Role:    * Karie Fetch, MD - Primary  ASSISTANTS:None   ANESTHESIA:   general  EBL:  Total I/O In: 2000 [I.V.:2000] Out: 90 [Urine:90]  BLOOD ADMINISTERED:none  DRAINS: (80F) Jackson-Pratt drain(s) with closed bulb suction in the Breast   LOCAL MEDICATIONS USED:  MARCAINE     SPECIMEN:  Source of Specimen:  Breast  DISPOSITION OF SPECIMEN:  PATHOLOGY  COUNTS:  YES  DICTATION: .Other Dictation: Dictation Number [id number]  PLAN OF CARE: Discharge to home after PACU  PATIENT DISPOSITION:  PACU - hemodynamically stable.   Delay start of Pharmacological VTE agent (>24hrs) due to surgical blood loss or risk of bleeding: not applicable

## 2011-11-01 ENCOUNTER — Encounter (HOSPITAL_BASED_OUTPATIENT_CLINIC_OR_DEPARTMENT_OTHER): Payer: Self-pay | Admitting: Plastic Surgery

## 2011-11-01 NOTE — Op Note (Signed)
Suzanne Trujillo, Suzanne Trujillo             ACCOUNT NO.:  [id number]  MEDICAL RECORD NO.:  [id number]  LOCATION:                                 FACILITY:  PHYSICIAN:  Brantley Persons, M.D.DATE OF BIRTH:  11/30/1970  DATE OF PROCEDURE:  10/31/2011 DATE OF DISCHARGE:  10/31/2011                              OPERATIVE REPORT   PREOPERATIVE DIAGNOSIS:  Bilateral macromastia.  POSTOPERATIVE DIAGNOSIS:  Bilateral macromastia.  PROCEDURE:  Bilateral reduction mammoplasties.  ATTENDING SURGEON:  Brantley Persons, MD  ANESTHESIA:  General.  ANESTHESIOLOGIST:  Bedelia Person, MD  FLUID REPLACEMENT:  2800 mL crystalloid.  URINE OUTPUT:  100 mL.  ESTIMATED BLOOD LOSS:  150 mL.  COMPLICATIONS:  None.  INDICATIONS FOR THE PROCEDURE:  The patient is a 41 year old Caucasian female who has bilateral macromastia that is clinically symptomatic. She presents to undergo bilateral reduction mammoplasties.  PROCEDURE:  The patient was marked in the preop holding area, taken back to the OR, and placed on the table in supine position.  The breasts and chest were prepped with Techni-Care and draped in sterile fashion. After adequate hemostasis and anesthesia taken effect, the procedure was begun.  The bases of the breasts had been injected with 1% lidocaine with epinephrine.  Both of the reductions were performed in the following similar manner. The nipple-areolar complex was marked with the 45-mm nipple marker. Both nipple-areolar complexes actually had scars on them with the left one being somewhat retracted due to the scarring.  However, the patient is aware that the scarring must remain postoperatively.  The skin was then incised and deepithelialized around the nipple-areolar complex down to the inframammary crease in inferior pedicle pattern.  Next, the medial, superior, and lateral skin flaps were elevated down to the chest wall.  Excess fat and glandular tissue were removed from the  inferior pedicle.  The nipple-areolar complex was examined and found to be pink and viable.  The wound was irrigated with saline irrigation.  Meticulous hemostasis was obtained with the Bovie electrocautery.  The nipple- areolar complex was then centralized using 3-0 Prolene suture.  A #10 JP flat fully-fluted drain was placed into the wound.  The pectoralis major muscle, surrounding soft tissues were infiltrated with 0.5% Marcaine with epinephrine to provide a postsurgical anesthetic block.  The skin flaps were brought together at the inverted T junction with a 2-0 Prolene suture.  The incisions were then stapled for temporary closure. The breasts were compared and found to have good shape and symmetry. The incisions were then closed from the medial aspect of JP drain to the medial aspect of the [facility] incision by first placing a few 3-0 Monocryl sutures to loosely tack together the dermal layer and then both the dermal and cuticular layer were closed in a single layer using a 2-0 Quill PDO barbed suture.  Lateral to the JP drain, incision was closed using 3-0 Monocryl in the dermal layer, followed by 3-0 Monocryl running intracuticular stitch on the skin.  The vertical limb of the Wise pattern was also closed in the dermal layer using 3-0 Monocryl suture.  The patient was placed in the upright position.  The future location of the nipple-areolar complex was  marked with a 45-mm nipple marker.  She was then placed back in the recumbent position.  Both of the nipple-areolar complexes were brought out onto the breast mounds in the following similar manner.  The skin was incised as marked and the soft tissue removed full thickness.  The nipple-areolar complex was examined, found to be pink and viable then brought out through this aperture and sewn in place using 4-0 Monocryl in the dermal layer, followed by 5-0 Monocryl running intracuticular stitch on the skin. This 5-0 Monocryl suture was  then brought down to close the cuticular layer of the vertical limb as well.  The skin and soft tissues at the base of the breasts were then infiltrated with 0.5% percent Marcaine with epinephrine to provide a postsurgical anesthetic block.  The JP drain was sewn in place using 3-0 nylon suture.  The incisions were dressed with benzoin and Steri-Strips and the nipples additionally with bacitracin ointment and Adaptic.  4x4s were placed over the incisions and the patient was placed into light postoperative support bra.  There were no complications.  The patient tolerated the procedure well.  The final needle and sponge counts were reported to be correct at the end of the case.  The patient was then extubated and taken to the recovery room in stable condition.  She was also recovered without complications.  Both the patient and her husband were given proper postoperative wound care instructions including care of the JP drains.  She was then discharged home in the care of her husband in stable condition.  Followup appointment will be Friday in the office.          ______________________________ Brantley Persons, M.D.     MC/MEDQ  D:  10/31/2011  T:  11/01/2011  Job:  [id number]

## 2015-05-10 ENCOUNTER — Other Ambulatory Visit: Payer: Self-pay

## 2015-05-10 DIAGNOSIS — Z9889 Other specified postprocedural states: Secondary | ICD-10-CM

## 2015-05-10 DIAGNOSIS — N6310 Unspecified lump in the right breast, unspecified quadrant: Secondary | ICD-10-CM

## 2015-05-23 ENCOUNTER — Other Ambulatory Visit: Payer: Self-pay | Admitting: Family Medicine

## 2015-05-23 ENCOUNTER — Other Ambulatory Visit: Payer: Self-pay

## 2015-05-23 DIAGNOSIS — N6310 Unspecified lump in the right breast, unspecified quadrant: Secondary | ICD-10-CM

## 2015-05-23 DIAGNOSIS — Z9889 Other specified postprocedural states: Secondary | ICD-10-CM

## 2015-05-25 ENCOUNTER — Ambulatory Visit
Admission: RE | Admit: 2015-05-25 | Discharge: 2015-05-25 | Disposition: A | Discharge status: Home / Self Care | Payer: Managed Care, Other (non HMO) | Source: Ambulatory Visit | Attending: Family Medicine | Admitting: Family Medicine

## 2015-05-25 ENCOUNTER — Other Ambulatory Visit: Payer: Self-pay | Admitting: Family Medicine

## 2015-05-25 DIAGNOSIS — Z9889 Other specified postprocedural states: Secondary | ICD-10-CM

## 2015-05-25 DIAGNOSIS — N6320 Unspecified lump in the left breast, unspecified quadrant: Secondary | ICD-10-CM

## 2015-05-25 DIAGNOSIS — N6310 Unspecified lump in the right breast, unspecified quadrant: Secondary | ICD-10-CM

## 2015-06-01 ENCOUNTER — Ambulatory Visit
Admission: RE | Admit: 2015-06-01 | Discharge: 2015-06-01 | Disposition: A | Discharge status: Home / Self Care | Payer: Managed Care, Other (non HMO) | Source: Ambulatory Visit | Attending: Family Medicine | Admitting: Family Medicine

## 2015-06-01 ENCOUNTER — Other Ambulatory Visit: Payer: Self-pay | Admitting: Family Medicine

## 2015-06-01 DIAGNOSIS — N6310 Unspecified lump in the right breast, unspecified quadrant: Secondary | ICD-10-CM

## 2015-06-01 DIAGNOSIS — Z9889 Other specified postprocedural states: Secondary | ICD-10-CM

## 2015-06-01 DIAGNOSIS — N6320 Unspecified lump in the left breast, unspecified quadrant: Secondary | ICD-10-CM

## 2015-11-01 ENCOUNTER — Other Ambulatory Visit: Payer: Self-pay | Admitting: Family Medicine

## 2015-11-01 DIAGNOSIS — N6320 Unspecified lump in the left breast, unspecified quadrant: Secondary | ICD-10-CM

## 2015-12-07 ENCOUNTER — Ambulatory Visit
Admission: RE | Admit: 2015-12-07 | Discharge: 2015-12-07 | Disposition: A | Discharge status: Home / Self Care | Payer: Managed Care, Other (non HMO) | Source: Ambulatory Visit | Attending: Family Medicine | Admitting: Family Medicine

## 2015-12-07 DIAGNOSIS — N6320 Unspecified lump in the left breast, unspecified quadrant: Secondary | ICD-10-CM

## 2016-04-03 ENCOUNTER — Other Ambulatory Visit: Payer: Self-pay | Admitting: Family Medicine

## 2016-04-03 DIAGNOSIS — Z1231 Encounter for screening mammogram for malignant neoplasm of breast: Secondary | ICD-10-CM

## 2016-05-30 ENCOUNTER — Ambulatory Visit
Admission: RE | Admit: 2016-05-30 | Discharge: 2016-05-30 | Disposition: A | Discharge status: Home / Self Care | Payer: Managed Care, Other (non HMO) | Source: Ambulatory Visit | Attending: Family Medicine | Admitting: Family Medicine

## 2016-05-30 DIAGNOSIS — Z1231 Encounter for screening mammogram for malignant neoplasm of breast: Secondary | ICD-10-CM

## 2017-04-17 ENCOUNTER — Encounter: Payer: Self-pay | Admitting: Gynecologic Oncology

## 2017-04-17 ENCOUNTER — Telehealth: Payer: Self-pay | Admitting: Gynecologic Oncology

## 2017-04-17 NOTE — Telephone Encounter (Signed)
Appt has been scheduled for the pt to see Dr. Andrey Farmerossi on 9/24 at 1130am. Pt aware to arrive 30 minutes early. Address and insurance verified. Letter mailed to the pt. She have the referring office fax over the records.

## 2017-04-29 ENCOUNTER — Encounter: Payer: Self-pay | Admitting: Gynecologic Oncology

## 2017-04-29 ENCOUNTER — Ambulatory Visit: Payer: Commercial Managed Care - PPO | Attending: Gynecologic Oncology | Admitting: Gynecologic Oncology

## 2017-04-29 VITALS — BP 133/77 | HR 79 | Temp 98.7°F | Resp 20 | Ht 64.0 in | Wt 149.4 lb

## 2017-04-29 DIAGNOSIS — E039 Hypothyroidism, unspecified: Secondary | ICD-10-CM | POA: Diagnosis not present

## 2017-04-29 DIAGNOSIS — Z87891 Personal history of nicotine dependence: Secondary | ICD-10-CM | POA: Diagnosis not present

## 2017-04-29 DIAGNOSIS — Z90710 Acquired absence of both cervix and uterus: Secondary | ICD-10-CM | POA: Insufficient documentation

## 2017-04-29 DIAGNOSIS — M26609 Unspecified temporomandibular joint disorder, unspecified side: Secondary | ICD-10-CM | POA: Diagnosis not present

## 2017-04-29 DIAGNOSIS — Z90721 Acquired absence of ovaries, unilateral: Secondary | ICD-10-CM

## 2017-04-29 DIAGNOSIS — R1900 Intra-abdominal and pelvic swelling, mass and lump, unspecified site: Secondary | ICD-10-CM

## 2017-04-29 DIAGNOSIS — N9489 Other specified conditions associated with female genital organs and menstrual cycle: Secondary | ICD-10-CM

## 2017-04-29 DIAGNOSIS — N83202 Unspecified ovarian cyst, left side: Secondary | ICD-10-CM

## 2017-04-29 NOTE — Patient Instructions (Addendum)
Preparing for your Surgery  Plan for surgery on May 21, 2017 with Dr. Adolphus Birchwood at [facility].  You will be scheduled for robotic assisted ovarian cystectomy with left salpingectomy, possible oophorectomy.  Pre-operative Testing -You will receive a phone call from presurgical testing at [facility] to arrange for a pre-operative testing appointment before your surgery.  This appointment normally occurs one to two weeks before your scheduled surgery.   -Bring your insurance card, copy of an advanced directive if applicable, medication list  -At that visit, you will be asked to sign a consent for a possible blood transfusion in case a transfusion becomes necessary during surgery.  The need for a blood transfusion is rare but having consent is a necessary part of your care.     -You should not be taking blood thinners or aspirin at least ten days prior to surgery unless instructed by your surgeon.  Day Before Surgery at Home -You will be asked to take in a light diet the day before surgery.  Avoid carbonated beverages.  You will be advised to have nothing to eat or drink after midnight the evening before.    Eat a light diet the day before surgery.  Examples including soups, broths, toast, yogurt, mashed potatoes.  Things to avoid include carbonated beverages (fizzy beverages), raw fruits and raw vegetables, or beans.   If your bowels are filled with gas, your surgeon will have difficulty visualizing your pelvic organs which increases your surgical risks.  Your role in recovery Your role is to become active as soon as directed by your doctor, while still giving yourself time to heal.  Rest when you feel tired. You will be asked to do the following in order to speed your recovery:  - Cough and breathe deeply. This helps toclear and expand your lungs and can prevent pneumonia. You may be given a spirometer to practice deep breathing. A staff member will show  you how to use the spirometer. - Do mild physical activity. Walking or moving your legs help your circulation and body functions return to normal. A staff member will help you when you try to walk and will provide you with simple exercises. Do not try to get up or walk alone the first time. - Actively manage your pain. Managing your pain lets you move in comfort. We will ask you to rate your pain on a scale of zero to 10. It is your responsibility to tell your doctor or nurse where and how much you hurt so your pain can be treated.  Special Considerations -If you are diabetic, you may be placed on insulin after surgery to have closer control over your blood sugars to promote healing and recovery.  This does not mean that you will be discharged on insulin.  If applicable, your oral antidiabetics will be resumed when you are tolerating a solid diet.  -Your final pathology results from surgery should be available by the Friday after surgery and the results will be relayed to you when available.  -Dr. Antionette Char is the Surgeon that assists your GYN Oncologist with surgery.  The next day after your surgery you will either see your GYN Oncologist or Dr. Antionette Char.   Blood Transfusion Information WHAT IS A BLOOD TRANSFUSION? A transfusion is the replacement of blood or some of its parts. Blood is made up of multiple cells which provide different functions.  Red blood cells carry oxygen and are used for blood loss replacement.  White blood cells fight against infection.  Platelets control bleeding.  Plasma helps clot blood.  Other blood products are available for specialized needs, such as hemophilia or other clotting disorders. BEFORE THE TRANSFUSION  Who gives blood for transfusions?   You may be able to donate blood to be used at a later date on yourself (autologous donation).  Relatives can be asked to donate blood. This is generally not any safer than if you have received  blood from a stranger. The same precautions are taken to ensure safety when a relative's blood is donated.  Healthy volunteers who are fully evaluated to make sure their blood is safe. This is blood bank blood. Transfusion therapy is the safest it has ever been in the practice of medicine. Before blood is taken from a donor, a complete history is taken to make sure that person has no history of diseases nor engages in risky social behavior (examples are intravenous drug use or sexual activity with multiple partners). The donor's travel history is screened to minimize risk of transmitting infections, such as malaria. The donated blood is tested for signs of infectious diseases, such as HIV and hepatitis. The blood is then tested to be sure it is compatible with you in order to minimize the chance of a transfusion reaction. If you or a relative donates blood, this is often done in anticipation of surgery and is not appropriate for emergency situations. It takes many days to process the donated blood. RISKS AND COMPLICATIONS Although transfusion therapy is very safe and saves many lives, the main dangers of transfusion include:   Getting an infectious disease.  Developing a transfusion reaction. This is an allergic reaction to something in the blood you were given. Every precaution is taken to prevent this. The decision to have a blood transfusion has been considered carefully by your caregiver before blood is given. Blood is not given unless the benefits outweigh the risks.

## 2017-04-29 NOTE — Progress Notes (Signed)
Consult Note: Gyn-Onc  Consult was requested by Dr. Aldona Bar for the evaluation of Suzanne Trujillo 46 y.o. female  CC:  Chief Complaint  Patient presents with  . Pelvic mass    Assessment/Plan:  Suzanne Trujillo  is a 46 y.o.  year old with a left sided cystic pelvic mass (7cm) and normal CA 125 in the setting of low back and left pelvic pain.  I discussed with Suzanne Trujillo that I agree with Dr Aldona Bar that I do not think that her back pain is likely caused by this cyst. However, the patient feels strongly that if there is even a small chance it is, she would like surgery to remove it.  She has had the right ovary removed. Therefore we would want to try to preserve a left ovary if possible. However, if cystectomy is not achievable, we would have to remove the ovary and give estrogen replacement postop.  I explained risks of surgery including  bleeding, infection, damage to internal organs (such as bladder,ureters, bowels), blood clot, reoperation and rehospitalization. I explained that prior surgery and history of endometriosis increases these risks. I discussed that there was no guarantee that the pain would be better postop. I explained that I will prescribe 1 prescription of opioids postop but no others unless complications arise.  Surgery is sheduled for October.   HPI: Suzanne Trujillo is a 46 year old parous woman who is seen in consultation at the request of Dr Aldona Bar for a left ovarian vs fallopian tube cyst.  The patient has a remote history of endometriosis and cervical dysplasia and underwent a TAH, RSO with Dr Aldona Bar for this at age 41.  1 year ago she began experiencing intermittent severe low back pains and intermittent left sciatic symptoms. The onset came after an injury during exercise. The pain is worse with twisting and turning and lumbar MRI on 04/10/17 showed a 7cm fluid collection in the left hemipelvis.   A TVUS was performed on 04/16/17 and showed a complex mass in the  left adnexa measuring 6.6cm it is lateral to the ovary and is anechoic. Is may be a fallopian tube. The left ovary appears lubluated with an area of normal ovarian tissue then a 3.6cm complex mass (likely hemorrhagic cyst).  CA 125 was normal on 04/16/17 at 9.8.  The patient's abdominal surgical history includes a cesarean and a TAH, RSO.  She takes percocet intermittently for the pain but not every day.   Current Meds:  Outpatient Encounter Prescriptions as of 04/29/2017  Medication Sig  . ALPRAZolam (XANAX) 0.5 MG tablet alprazolam 0.5 mg tablet  . docusate sodium (COLACE) 100 MG capsule Take 100 mg by mouth daily.  . meloxicam (MOBIC) 7.5 MG tablet Take 7.5 mg by mouth daily as needed for pain.  Marland Kitchen oxyCODONE-acetaminophen (PERCOCET/ROXICET) 5-325 MG tablet oxycodone-acetaminophen 5 mg-325 mg tablet  . ustekinumab (STELARA) 45 MG/0.5ML SOSY injection Inject 45 mg into the skin. Injection is every 12 weeks  . [DISCONTINUED] methocarbamol (ROBAXIN) 750 MG tablet methocarbamol 750 mg tablet  . [DISCONTINUED] citalopram (CELEXA) 20 MG tablet Take 20 mg by mouth daily. AM  . [DISCONTINUED] levothyroxine (SYNTHROID, LEVOTHROID) 75 MCG tablet Take 75 mcg by mouth daily. AM  . [DISCONTINUED] Multiple Vitamin (MULTIVITAMIN) tablet Take 1 tablet by mouth daily.   No facility-administered encounter medications on file as of 04/29/2017.     Allergy: No Known Allergies  Social Hx:   Social History   Social History  . Marital status:  Married    Spouse name: N/A  . Number of children: N/A  . Years of education: N/A   Occupational History  . Not on file.   Social History Main Topics  . Smoking status: Former Games developer  . Smokeless tobacco: Never Used     Comment: stopped smoking 1 week ago  . Alcohol use Yes     Comment: occasionally  . Drug use: No  . Sexual activity: Not on file   Other Topics Concern  . Not on file   Social History Narrative  . No narrative on file    Past  Surgical Hx:  Past Surgical History:  Procedure Laterality Date  . ABDOMINAL HYSTERECTOMY  02/18/2003   ex. lap., lysis of adhesions, TAH, right salpingo-oophorectomy  . BREAST BIOPSY  08/01/2010   right  . BREAST LUMPECTOMY  05/30/2007   exc. left breast papilloma  . BREAST REDUCTION SURGERY  10/31/2011   Procedure: MAMMARY REDUCTION  (BREAST);  Surgeon: Karie Fetch, MD;  Location: [facility];  Service: Plastics;  Laterality: Bilateral;  . CESAREAN SECTION  1998  . HEMORROIDECTOMY  2003  . TEMPOROMANDIBULAR JOINT SURGERY     x 2    Past Medical Hx:  Past Medical History:  Diagnosis Date  . Dermatitis 10/25/2011   beneath breasts  . Hypothyroidism   . Macromastia 10/2011  . Seasonal allergies   . TMJ (dislocation of temporomandibular joint)     Past Gynecological History:   No LMP recorded. Patient has had a hysterectomy.  Family Hx: History reviewed. No pertinent family history.  Review of Systems:  Constitutional  Feels well,    ENT Normal appearing ears and nares bilaterally Skin/Breast  No rash, sores, jaundice, itching, dryness Cardiovascular  No chest pain, shortness of breath, or edema  Pulmonary  No cough or wheeze.  Gastro Intestinal  No nausea, vomitting, or diarrhoea. No bright red blood per rectum, no abdominal pain, change in bowel movement, or constipation.  Genito Urinary  No frequency, urgency, dysuria, + pelvic pain Musculo Skeletal  + back pain Neurologic  No weakness, numbness, change in gait,  Psychology  No depression, anxiety, insomnia.   Vitals:  Blood pressure 133/77, pulse 79, temperature 98.7 F (37.1 C), temperature source Oral, resp. rate 20, height  (1.626 m), weight 149 lb 6.4 oz (67.8 kg), SpO2 100 %.  Physical Exam: WD in NAD Neck  Supple NROM, without any enlargements.  Lymph Node Survey No cervical supraclavicular or inguinal adenopathy Cardiovascular  Pulse normal rate, regularity and rhythm.  S1 and S2 normal.  Lungs  Clear to auscultation bilateraly, without wheezes/crackles/rhonchi. Good air movement.  Skin  No rash/lesions/breakdown  Psychiatry  Alert and oriented to person, place, and time  Abdomen  Normoactive bowel sounds, abdomen soft, non-tender and nonobese without evidence of hernia.  Back No CVA tenderness Genito Urinary  Vulva/vagina: Normal external female genitalia.   No lesions. No discharge or bleeding.  Bladder/urethra:  No lesions or masses, well supported bladder  Vagina: normal, cuff smooth. No masses felt.  Rectal  Good tone, no masses no cul de sac nodularity.  Extremities  No bilateral cyanosis, clubbing or edema.   Quinn Axe, MD  04/29/2017, 5:09 PM

## 2017-05-01 ENCOUNTER — Telehealth: Payer: Self-pay | Admitting: Gynecologic Oncology

## 2017-05-01 NOTE — Patient Instructions (Addendum)
Suzanne Trujillo  05/01/2017   Your procedure is scheduled on: Tuesday 05-07-17  Report to [facility] ([facility]) Main  Entrance Take [facility]  elevators to 3rd floor to  [facility] at 730 AM.   Call this number if you have problems the morning of surgery [phone]   Remember: ONLY 1 PERSON MAY GO WITH YOU TO [facility] TO GET  READY MORNING OF YOUR SURGERY.  Do not eat food or drink liquids :After Midnight.  Eat a light diet the day before surgery on Monday 05-06-17 .  Examples including soups, broths, toast, yogurt, mashed potatoes.  Things to avoid include carbonated beverages (fizzy beverages), raw fruits and raw vegetables, or beans.   If your bowels are filled with gas, your surgeon will have difficulty visualizing your pelvic organs which increases your surgical risks.   Take these medicines the morning of surgery with A SIP OF WATER:alprazolam (xanax) if needed, oxycodone if needed                                You may not have any metal on your body including hair pins and              piercings  Do not wear jewelry, make-up, lotions, powders or perfumes, deodorant             Do not wear nail polish.  Do not shave  48 hours prior to surgery.              Men may shave face and neck.   Do not bring valuables to the hospital. [facility] IS NOT             RESPONSIBLE   FOR VALUABLES.  Contacts, dentures or bridgework may not be worn into surgery.  Leave suitcase in the car. After surgery it may be brought to your room.     Patients discharged the day of surgery will not be allowed to drive home.  Name and phone number of your driver: husband [facility] cell [phone]  Special Instructions: N/A              Please read over the following fact sheets you were given: _____________________________________________________________________   Preparing for surgery:          Before surgery, you can play an important role.  Because skin is not sterile, your  skin needs to be as free of germs as possible.  You can reduce the number of germs on your skin by washing with CHG (chlorahexidine gluconate) soap before surgery.  CHG is an antiseptic cleaner which kills germs and bonds with the skin to continue killing germs even after washing. Please DO NOT use if you have an allergy to CHG or antibacterial soaps.  If your skin becomes reddened/irritated stop using the CHG and inform your nurse when you arrive at [facility]. Do not shave (including legs and underarms) for at least 48 hours prior to the first CHG shower.  You may shave your face/neck. Please follow these instructions carefully:  1.  Shower with CHG Soap the night before surgery and the  morning of Surgery.  2.  If you choose to wash your hair, wash your hair first as usual with your  normal  shampoo.  3.  After you shampoo, rinse your hair and  body thoroughly to remove the  shampoo.                           4.  Use CHG as you would any other liquid soap.  You can apply chg directly  to the skin and wash                       Gently with a scrungie or clean washcloth.  5.  Apply the CHG Soap to your body ONLY FROM THE NECK DOWN.   Do not use on face/ open                           Wound or open sores. Avoid contact with eyes, ears mouth and genitals (private parts).                       Wash face,  Genitals (private parts) with your normal soap.             6.  Wash thoroughly, paying special attention to the area where your surgery  will be performed.  7.  Thoroughly rinse your body with warm water from the neck down.  8.  DO NOT shower/wash with your normal soap after using and rinsing off  the CHG Soap.                9.  Pat yourself dry with a clean towel.            10.  Wear clean pajamas.            11.  Place clean sheets on your bed the night of your first shower and do not  sleep with pets. Day of Surgery : Do not apply any lotions/deodorants the morning of surgery.  Please wear  clean clothes to the hospital/surgery [facility].  FAILURE TO FOLLOW THESE INSTRUCTIONS MAY RESULT IN THE CANCELLATION OF YOUR SURGERY PATIENT SIGNATURE_________________________________  NURSE SIGNATURE__________________________________  ________________________________________________________________________   [facility]  An incentive spirometer is a tool that can help keep your lungs clear and active. This tool measures how well you are filling your lungs with each breath. Taking long deep breaths may help reverse or decrease the chance of developing breathing (pulmonary) problems (especially infection) following:  A long period of time when you are unable to move or be active. BEFORE THE PROCEDURE   If the spirometer includes an indicator to show your best effort, your nurse or respiratory therapist will set it to a desired goal.  If possible, sit up straight or lean slightly forward. Try not to slouch.  Hold the incentive spirometer in an upright position. INSTRUCTIONS FOR USE  1. Sit on the edge of your bed if possible, or sit up as far as you can in bed or on a chair. 2. Hold the incentive spirometer in an upright position. 3. Breathe out normally. 4. Place the mouthpiece in your mouth and seal your lips tightly around it. 5. Breathe in slowly and as deeply as possible, raising the piston or the ball toward the top of the column. 6. Hold your breath for 3-5 seconds or for as long as possible. Allow the piston or ball to fall to the bottom of the column. 7. Remove the mouthpiece from your mouth and breathe out normally. 8. Rest for a few seconds and repeat Steps 1 through  7 at least 10 times every 1-2 hours when you are awake. Take your time and take a few normal breaths between deep breaths. 9. The spirometer may include an indicator to show your best effort. Use the indicator as a goal to work toward during each repetition. 10. After each set of 10 deep breaths, practice  coughing to be sure your lungs are clear. If you have an incision (the cut made at the time of surgery), support your incision when coughing by placing a pillow or rolled up towels firmly against it. Once you are able to get out of bed, walk around indoors and cough well. You may stop using the incentive spirometer when instructed by your caregiver.  RISKS AND COMPLICATIONS  Take your time so you do not get dizzy or light-headed.  If you are in pain, you may need to take or ask for pain medication before doing incentive spirometry. It is harder to take a deep breath if you are having pain. AFTER USE  Rest and breathe slowly and easily.  It can be helpful to keep track of a log of your progress. Your caregiver can provide you with a simple table to help with this. If you are using the spirometer at home, follow these instructions: SEEK MEDICAL CARE IF:   You are having difficultly using the spirometer.  You have trouble using the spirometer as often as instructed.  Your pain medication is not giving enough relief while using the spirometer.  You develop fever of 100.5 F (38.1 C) or higher. SEEK IMMEDIATE MEDICAL CARE IF:   You cough up bloody sputum that had not been present before.  You develop fever of 102 F (38.9 C) or greater.  You develop worsening pain at or near the incision site. MAKE SURE YOU:   Understand these instructions.  Will watch your condition.  Will get help right away if you are not doing well or get worse. Document Released: 12/03/2006 Document Revised: 10/15/2011 Document Reviewed: 02/03/2007 Exit[employer] Patient Information 2014 Exit[employer], [place].   ________________________________________________________________________  WHAT IS A BLOOD TRANSFUSION? Blood Transfusion Information  A transfusion is the replacement of blood or some of its parts. Blood is made up of multiple cells which provide different functions.  Red blood cells carry oxygen and are  used for blood loss replacement.  White blood cells fight against infection.  Platelets control bleeding.  Plasma helps clot blood.  Other blood products are available for specialized needs, such as hemophilia or other clotting disorders. BEFORE THE TRANSFUSION  Who gives blood for transfusions?   Healthy volunteers who are fully evaluated to make sure their blood is safe. This is blood bank blood. Transfusion therapy is the safest it has ever been in the practice of medicine. Before blood is taken from a donor, a complete history is taken to make sure that person has no history of diseases nor engages in risky social behavior (examples are intravenous drug use or sexual activity with multiple partners). The donor's travel history is screened to minimize risk of transmitting infections, such as malaria. The donated blood is tested for signs of infectious diseases, such as HIV and hepatitis. The blood is then tested to be sure it is compatible with you in order to minimize the chance of a transfusion reaction. If you or a relative donates blood, this is often done in anticipation of surgery and is not appropriate for emergency situations. It takes many days to process the donated blood. RISKS AND COMPLICATIONS  Although transfusion therapy is very safe and saves many lives, the main dangers of transfusion include:   Getting an infectious disease.  Developing a transfusion reaction. This is an allergic reaction to something in the blood you were given. Every precaution is taken to prevent this. The decision to have a blood transfusion has been considered carefully by your caregiver before blood is given. Blood is not given unless the benefits outweigh the risks. AFTER THE TRANSFUSION  Right after receiving a blood transfusion, you will usually feel much better and more energetic. This is especially true if your red blood cells have gotten low (anemic). The transfusion raises the level of the red  blood cells which carry oxygen, and this usually causes an energy increase.  The nurse administering the transfusion will monitor you carefully for complications. HOME CARE INSTRUCTIONS  No special instructions are needed after a transfusion. You may find your energy is better. Speak with your caregiver about any limitations on activity for underlying diseases you may have. SEEK MEDICAL CARE IF:   Your condition is not improving after your transfusion.  You develop redness or irritation at the intravenous (IV) site. SEEK IMMEDIATE MEDICAL CARE IF:  Any of the following symptoms occur over the next 12 hours:  Shaking chills.  You have a temperature by mouth above 102 F (38.9 C), not controlled by medicine.  Chest, back, or muscle pain.  People around you feel you are not acting correctly or are confused.  Shortness of breath or difficulty breathing.  Dizziness and fainting.  You get a rash or develop hives.  You have a decrease in urine output.  Your urine turns a dark color or changes to pink, red, or brown. Any of the following symptoms occur over the next 10 days:  You have a temperature by mouth above 102 F (38.9 C), not controlled by medicine.  Shortness of breath.  Weakness after normal activity.  The white part of the eye turns yellow (jaundice).  You have a decrease in the amount of urine or are urinating less often.  Your urine turns a dark color or changes to pink, red, or brown. Document Released: 07/20/2000 Document Revised: 10/15/2011 Document Reviewed: 03/08/2008 [facility] Patient Information 2014 [place], [place].  _______________________________________________________________________

## 2017-05-01 NOTE — Telephone Encounter (Signed)
Called and offered patient the opportunity to move her surgery from Oct 16 to Oct 2.  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  Advised to call for any needs.

## 2017-05-01 NOTE — Telephone Encounter (Signed)
Patient called back and would like to move surgery to Oct 2.  No concerns voiced.  Advised she would be hearing from pre-surgical testing.

## 2017-05-02 ENCOUNTER — Encounter (HOSPITAL_COMMUNITY)
Admission: RE | Admit: 2017-05-02 | Discharge: 2017-05-02 | Disposition: A | Discharge status: Home / Self Care | Payer: Commercial Managed Care - PPO | Source: Ambulatory Visit | Attending: Gynecologic Oncology | Admitting: Gynecologic Oncology

## 2017-05-02 ENCOUNTER — Encounter (HOSPITAL_COMMUNITY): Payer: Self-pay

## 2017-05-02 DIAGNOSIS — Z01818 Encounter for other preprocedural examination: Secondary | ICD-10-CM | POA: Insufficient documentation

## 2017-05-02 DIAGNOSIS — N83202 Unspecified ovarian cyst, left side: Secondary | ICD-10-CM | POA: Diagnosis not present

## 2017-05-02 HISTORY — DX: Dorsalgia, unspecified: M54.9

## 2017-05-02 HISTORY — DX: Pneumonia, unspecified organism: J18.9

## 2017-05-02 HISTORY — DX: Anxiety disorder, unspecified: F41.9

## 2017-05-02 HISTORY — DX: Unspecified ovarian cyst, left side: N83.202

## 2017-05-02 LAB — COMPREHENSIVE METABOLIC PANEL
ALT: 14 U/L (ref 14–54)
AST: 13 U/L — AB (ref 15–41)
Albumin: 4.5 g/dL (ref 3.5–5.0)
Alkaline Phosphatase: 61 U/L (ref 38–126)
Anion gap: 8 (ref 5–15)
BILIRUBIN TOTAL: 0.5 mg/dL (ref 0.3–1.2)
BUN: 9 mg/dL (ref 6–20)
CHLORIDE: 105 mmol/L (ref 101–111)
CO2: 23 mmol/L (ref 22–32)
CREATININE: 0.79 mg/dL (ref 0.44–1.00)
Calcium: 9.3 mg/dL (ref 8.9–10.3)
GFR calc Af Amer: >60 mL/min (ref >60)
Glucose, Bld: 90 mg/dL (ref 65–99)
Potassium: 4.1 mmol/L (ref 3.5–5.1)
Sodium: 136 mmol/L (ref 135–145)
TOTAL PROTEIN: 7.6 g/dL (ref 6.5–8.1)

## 2017-05-02 LAB — CBC
HCT: 38.7 % (ref 36.0–46.0)
HEMOGLOBIN: 13.7 g/dL (ref 12.0–15.0)
MCH: 31.4 pg (ref 26.0–34.0)
MCHC: 35.4 g/dL (ref 30.0–36.0)
MCV: 88.6 fL (ref 78.0–100.0)
Platelets: 291 10*3/uL (ref 150–400)
RBC: 4.37 MIL/uL (ref 3.87–5.11)
RDW: 12.2 % (ref 11.5–15.5)
WBC: 7.6 10*3/uL (ref 4.0–10.5)

## 2017-05-02 LAB — ABO/RH: ABO/RH(D): A POS

## 2017-05-07 ENCOUNTER — Encounter (HOSPITAL_COMMUNITY): Payer: Self-pay | Admitting: Gynecologic Oncology

## 2017-05-07 ENCOUNTER — Encounter (HOSPITAL_COMMUNITY)
Admission: RE | Disposition: A | Discharge status: Home / Self Care | Payer: Self-pay | Source: Ambulatory Visit | Attending: Gynecologic Oncology

## 2017-05-07 ENCOUNTER — Telehealth: Payer: Self-pay | Admitting: *Deleted

## 2017-05-07 ENCOUNTER — Ambulatory Visit (HOSPITAL_COMMUNITY): Payer: Commercial Managed Care - PPO | Admitting: Anesthesiology

## 2017-05-07 ENCOUNTER — Ambulatory Visit (HOSPITAL_COMMUNITY)
Admission: RE | Admit: 2017-05-07 | Discharge: 2017-05-07 | Disposition: A | Discharge status: Home / Self Care | Payer: Commercial Managed Care - PPO | Source: Ambulatory Visit | Attending: Gynecologic Oncology | Admitting: Gynecologic Oncology

## 2017-05-07 DIAGNOSIS — Z87891 Personal history of nicotine dependence: Secondary | ICD-10-CM | POA: Insufficient documentation

## 2017-05-07 DIAGNOSIS — E039 Hypothyroidism, unspecified: Secondary | ICD-10-CM | POA: Diagnosis not present

## 2017-05-07 DIAGNOSIS — N83202 Unspecified ovarian cyst, left side: Secondary | ICD-10-CM | POA: Diagnosis present

## 2017-05-07 DIAGNOSIS — N838 Other noninflammatory disorders of ovary, fallopian tube and broad ligament: Secondary | ICD-10-CM | POA: Diagnosis not present

## 2017-05-07 DIAGNOSIS — N9489 Other specified conditions associated with female genital organs and menstrual cycle: Secondary | ICD-10-CM | POA: Diagnosis not present

## 2017-05-07 DIAGNOSIS — N736 Female pelvic peritoneal adhesions (postinfective): Secondary | ICD-10-CM

## 2017-05-07 DIAGNOSIS — Z79899 Other long term (current) drug therapy: Secondary | ICD-10-CM | POA: Insufficient documentation

## 2017-05-07 DIAGNOSIS — Z90710 Acquired absence of both cervix and uterus: Secondary | ICD-10-CM | POA: Diagnosis not present

## 2017-05-07 DIAGNOSIS — N801 Endometriosis of ovary: Secondary | ICD-10-CM | POA: Diagnosis not present

## 2017-05-07 HISTORY — PX: ROBOTIC ASSISTED LAPAROSCOPIC OVARIAN CYSTECTOMY: SHX6081

## 2017-05-07 LAB — TYPE AND SCREEN
ABO/RH(D): A POS
Antibody Screen: NEGATIVE

## 2017-05-07 SURGERY — ROBOTIC ASSISTED LAPAROSCOPIC OVARIAN CYSTECTOMY
Anesthesia: General | Laterality: Left

## 2017-05-07 MED ORDER — OXYCODONE HCL 5 MG PO TABS
5.0000 mg | ORAL_TABLET | ORAL | Status: DC | PRN
  Administered 2017-05-07: 5 mg via ORAL

## 2017-05-07 MED ORDER — KETOROLAC TROMETHAMINE 30 MG/ML IJ SOLN
INTRAMUSCULAR | Status: AC
  Filled 2017-05-07: qty 1

## 2017-05-07 MED ORDER — MIDAZOLAM HCL 5 MG/5ML IJ SOLN
INTRAMUSCULAR | Status: DC | PRN
  Administered 2017-05-07: 2 mg via INTRAVENOUS

## 2017-05-07 MED ORDER — HYDROMORPHONE HCL-NACL 0.5-0.9 MG/ML-% IV SOSY
PREFILLED_SYRINGE | INTRAVENOUS | Status: AC
  Filled 2017-05-07: qty 2

## 2017-05-07 MED ORDER — PROMETHAZINE HCL 25 MG/ML IJ SOLN
INTRAMUSCULAR | Status: AC
  Filled 2017-05-07: qty 1

## 2017-05-07 MED ORDER — PROPOFOL 10 MG/ML IV BOLUS
INTRAVENOUS | Status: AC
  Filled 2017-05-07: qty 20

## 2017-05-07 MED ORDER — FENTANYL CITRATE (PF) 100 MCG/2ML IJ SOLN
INTRAMUSCULAR | Status: DC | PRN
  Administered 2017-05-07: 50 ug via INTRAVENOUS
  Administered 2017-05-07 (×2): 100 ug via INTRAVENOUS
  Administered 2017-05-07 (×2): 50 ug via INTRAVENOUS

## 2017-05-07 MED ORDER — LACTATED RINGERS IV SOLN
INTRAVENOUS | Status: DC
  Administered 2017-05-07 (×2): via INTRAVENOUS

## 2017-05-07 MED ORDER — ESTRADIOL 0.1 MG/24HR TD PTWK: 0.1000 mg | MEDICATED_PATCH | TRANSDERMAL | 12 refills | Status: DC

## 2017-05-07 MED ORDER — ACETAMINOPHEN 650 MG RE SUPP
650.0000 mg | RECTAL | Status: DC | PRN
  Filled 2017-05-07: qty 1

## 2017-05-07 MED ORDER — CEFAZOLIN SODIUM-DEXTROSE 2-4 GM/100ML-% IV SOLN
2.0000 g | INTRAVENOUS | Status: AC
  Administered 2017-05-07: 2 g via INTRAVENOUS
  Filled 2017-05-07: qty 100

## 2017-05-07 MED ORDER — PROPOFOL 10 MG/ML IV BOLUS
INTRAVENOUS | Status: DC | PRN
  Administered 2017-05-07: 140 mg via INTRAVENOUS
  Administered 2017-05-07: 50 mg via INTRAVENOUS

## 2017-05-07 MED ORDER — OXYCODONE HCL 5 MG PO TABS
ORAL_TABLET | ORAL | Status: AC
  Filled 2017-05-07: qty 2

## 2017-05-07 MED ORDER — STERILE WATER FOR IRRIGATION IR SOLN
Status: DC | PRN
  Administered 2017-05-07: 1000 mL

## 2017-05-07 MED ORDER — ACETAMINOPHEN 325 MG PO TABS: 650.0000 mg | ORAL_TABLET | ORAL | Status: DC | PRN

## 2017-05-07 MED ORDER — ONDANSETRON HCL 4 MG/2ML IJ SOLN
INTRAMUSCULAR | Status: AC
  Filled 2017-05-07: qty 2

## 2017-05-07 MED ORDER — HYDROMORPHONE HCL-NACL 0.5-0.9 MG/ML-% IV SOSY
0.2500 mg | PREFILLED_SYRINGE | INTRAVENOUS | Status: DC | PRN
  Administered 2017-05-07 (×6): 0.25 mg via INTRAVENOUS
  Administered 2017-05-07: 0.5 mg via INTRAVENOUS

## 2017-05-07 MED ORDER — PROMETHAZINE HCL 25 MG/ML IJ SOLN: 6.2500 mg | INTRAMUSCULAR | Status: DC | PRN

## 2017-05-07 MED ORDER — SUCCINYLCHOLINE CHLORIDE 20 MG/ML IJ SOLN
INTRAMUSCULAR | Status: DC | PRN
  Administered 2017-05-07: 120 mg via INTRAVENOUS

## 2017-05-07 MED ORDER — SENNOSIDES-DOCUSATE SODIUM 8.6-50 MG PO TABS: 1.0000 | ORAL_TABLET | Freq: Every day | ORAL | 3 refills | Status: DC

## 2017-05-07 MED ORDER — DEXAMETHASONE SODIUM PHOSPHATE 10 MG/ML IJ SOLN
INTRAMUSCULAR | Status: DC | PRN
  Administered 2017-05-07: 10 mg via INTRAVENOUS

## 2017-05-07 MED ORDER — FENTANYL CITRATE (PF) 100 MCG/2ML IJ SOLN
INTRAMUSCULAR | Status: AC
  Filled 2017-05-07: qty 2

## 2017-05-07 MED ORDER — OXYCODONE-ACETAMINOPHEN 5-325 MG PO TABS: 1.0000 | ORAL_TABLET | ORAL | 0 refills | Status: DC | PRN

## 2017-05-07 MED ORDER — FENTANYL CITRATE (PF) 250 MCG/5ML IJ SOLN
INTRAMUSCULAR | Status: AC
  Filled 2017-05-07: qty 5

## 2017-05-07 MED ORDER — ONDANSETRON HCL 4 MG/2ML IJ SOLN
INTRAMUSCULAR | Status: DC | PRN
  Administered 2017-05-07: 4 mg via INTRAVENOUS

## 2017-05-07 MED ORDER — SCOPOLAMINE 1 MG/3DAYS TD PT72
1.0000 | MEDICATED_PATCH | TRANSDERMAL | Status: DC
  Administered 2017-05-07: 1.5 mg via TRANSDERMAL
  Filled 2017-05-07: qty 1

## 2017-05-07 MED ORDER — LIDOCAINE 2% (20 MG/ML) 5 ML SYRINGE
INTRAMUSCULAR | Status: DC | PRN
  Administered 2017-05-07: 60 mg via INTRAVENOUS

## 2017-05-07 MED ORDER — LACTATED RINGERS IR SOLN
Status: DC | PRN
  Administered 2017-05-07: 1000 mL

## 2017-05-07 MED ORDER — MIDAZOLAM HCL 2 MG/2ML IJ SOLN
INTRAMUSCULAR | Status: AC
  Filled 2017-05-07: qty 2

## 2017-05-07 MED ORDER — SUGAMMADEX SODIUM 200 MG/2ML IV SOLN
INTRAVENOUS | Status: DC | PRN
  Administered 2017-05-07: 150 mg via INTRAVENOUS

## 2017-05-07 MED ORDER — KETOROLAC TROMETHAMINE 30 MG/ML IJ SOLN
30.0000 mg | Freq: Once | INTRAMUSCULAR | Status: DC | PRN
  Administered 2017-05-07: 30 mg via INTRAVENOUS

## 2017-05-07 MED ORDER — ROCURONIUM BROMIDE 10 MG/ML (PF) SYRINGE
PREFILLED_SYRINGE | INTRAVENOUS | Status: DC | PRN
  Administered 2017-05-07: 50 mg via INTRAVENOUS

## 2017-05-07 SURGICAL SUPPLY — 42 items
APPLICATOR SURGIFLO ENDO (HEMOSTASIS) ×3 IMPLANT
BAG LAPAROSCOPIC 12 15 PORT 16 (BASKET) IMPLANT
BAG RETRIEVAL 12/15 (BASKET)
CHLORAPREP W/TINT 26ML (MISCELLANEOUS) IMPLANT
COVER BACK TABLE 60X90IN (DRAPES) IMPLANT
COVER TIP SHEARS 8 DVNC (MISCELLANEOUS) ×2 IMPLANT
COVER TIP SHEARS 8MM DA VINCI (MISCELLANEOUS) ×1
DRAPE ARM DVNC X/XI (DISPOSABLE) ×8 IMPLANT
DRAPE COLUMN DVNC XI (DISPOSABLE) ×2 IMPLANT
DRAPE DA VINCI XI ARM (DISPOSABLE) ×4
DRAPE DA VINCI XI COLUMN (DISPOSABLE) ×1
DRAPE SHEET LG 3/4 BI-LAMINATE (DRAPES) ×3 IMPLANT
DRAPE SURG IRRIG POUCH 19X23 (DRAPES) ×3 IMPLANT
ELECT REM PT RETURN 15FT ADLT (MISCELLANEOUS) ×3 IMPLANT
FLOSEAL 10ML (HEMOSTASIS) ×3 IMPLANT
GLOVE BIO SURGEON STRL SZ 6 (GLOVE) ×12 IMPLANT
GLOVE BIO SURGEON STRL SZ 6.5 (GLOVE) ×6 IMPLANT
GOWN STRL REUS W/ TWL LRG LVL3 (GOWN DISPOSABLE) ×6 IMPLANT
GOWN STRL REUS W/TWL LRG LVL3 (GOWN DISPOSABLE) ×3
HOLDER FOLEY CATH W/STRAP (MISCELLANEOUS) IMPLANT
IRRIG SUCT STRYKERFLOW 2 WTIP (MISCELLANEOUS) ×3
IRRIGATION SUCT STRKRFLW 2 WTP (MISCELLANEOUS) ×2 IMPLANT
MANIPULATOR UTERINE 4.5 ZUMI (MISCELLANEOUS) IMPLANT
OBTURATOR OPTICAL STANDARD 8MM (TROCAR) ×1
OBTURATOR OPTICAL STND 8 DVNC (TROCAR) ×2
OBTURATOR OPTICALSTD 8 DVNC (TROCAR) ×2 IMPLANT
PACK ROBOT GYN CUSTOM WL (TRAY / TRAY PROCEDURE) ×3 IMPLANT
PAD POSITIONING PINK XL (MISCELLANEOUS) ×3 IMPLANT
POUCH SPECIMEN RETRIEVAL 10MM (ENDOMECHANICALS) ×3 IMPLANT
SEAL CANN UNIV 5-8 DVNC XI (MISCELLANEOUS) ×8 IMPLANT
SEAL XI 5MM-8MM UNIVERSAL (MISCELLANEOUS) ×4
SET TRI-LUMEN FLTR TB AIRSEAL (TUBING) ×3 IMPLANT
SOLUTION ELECTROLUBE (MISCELLANEOUS) IMPLANT
SUT VIC AB 0 CT1 27 (SUTURE)
SUT VIC AB 0 CT1 27XBRD ANTBC (SUTURE) IMPLANT
SUT VIC AB 3-0 SH 27 (SUTURE) ×6
SUT VIC AB 3-0 SH 27X BRD (SUTURE) ×12 IMPLANT
TOWEL OR NON WOVEN STRL DISP B (DISPOSABLE) ×3 IMPLANT
TRAP SPECIMEN MUCOUS 40CC (MISCELLANEOUS) IMPLANT
TRAY FOLEY W/METER SILVER 16FR (SET/KITS/TRAYS/PACK) ×3 IMPLANT
UNDERPAD 30X30 (UNDERPADS AND DIAPERS) ×3 IMPLANT
WATER STERILE IRR 1000ML POUR (IV SOLUTION) ×3 IMPLANT

## 2017-05-07 NOTE — H&P (View-Only) (Signed)
Consult Note: Gyn-Onc  Consult was requested by Dr. Aldona Bar for the evaluation of [facility] 46 y.o. female  CC:  Chief Complaint  Patient presents with  . Pelvic mass    Assessment/Plan:  Ms. Suzanne Trujillo  is a 46 y.o.  year old with a left sided cystic pelvic mass (7cm) and normal CA 125 in the setting of low back and left pelvic pain.  I discussed with Suzanne [facility]jillo that I agree with Dr Aldona Bar that I do not think that her back pain is likely caused by this cyst. However, the patient feels strongly that if there is even a small chance it is, she would like surgery to remove it.  She has had the right ovary removed. Therefore we would want to try to preserve a left ovary if possible. However, if cystectomy is not achievable, we would have to remove the ovary and give estrogen replacement postop.  I explained risks of surgery including  bleeding, infection, damage to internal organs (such as bladder,ureters, bowels), blood clot, reoperation and rehospitalization. I explained that prior surgery and history of endometriosis increases these risks. I discussed that there was no guarantee that the pain would be better postop. I explained that I will prescribe 1 prescription of opioids postop but no others unless complications arise.  Surgery is sheduled for October.   HPI: Suzanne Trujillo is a 46 year old parous woman who is seen in consultation at the request of Dr Aldona Bar for a left ovarian vs fallopian tube cyst.  The patient has a remote history of endometriosis and cervical dysplasia and underwent a TAH, RSO with Dr Aldona Bar for this at age 29.  1 year ago she began experiencing intermittent severe low back pains and intermittent left sciatic symptoms. The onset came after an injury during exercise. The pain is worse with twisting and turning and lumbar MRI on 04/10/17 showed a 7cm fluid collection in the left hemipelvis.   A TVUS was performed on 04/16/17 and showed a complex mass in the  left adnexa measuring 6.6cm it is lateral to the ovary and is anechoic. Is may be a fallopian tube. The left ovary appears lubluated with an area of normal ovarian tissue then a 3.6cm complex mass (likely hemorrhagic cyst).  CA 125 was normal on 04/16/17 at 9.8.  The patient's abdominal surgical history includes a cesarean and a TAH, RSO.  She takes percocet intermittently for the pain but not every day.   Current Meds:  Outpatient Encounter Prescriptions as of 04/29/2017  Medication Sig  . ALPRAZolam (XANAX) 0.5 MG tablet alprazolam 0.5 mg tablet  . docusate sodium (COLACE) 100 MG capsule Take 100 mg by mouth daily.  . meloxicam (MOBIC) 7.5 MG tablet Take 7.5 mg by mouth daily as needed for pain.  Marland Kitchen oxyCODONE-acetaminophen (PERCOCET/ROXICET) 5-325 MG tablet oxycodone-acetaminophen 5 mg-325 mg tablet  . ustekinumab (STELARA) 45 MG/0.5ML SOSY injection Inject 45 mg into the skin. Injection is every 12 weeks  . [DISCONTINUED] methocarbamol (ROBAXIN) 750 MG tablet methocarbamol 750 mg tablet  . [DISCONTINUED] citalopram (CELEXA) 20 MG tablet Take 20 mg by mouth daily. AM  . [DISCONTINUED] levothyroxine (SYNTHROID, LEVOTHROID) 75 MCG tablet Take 75 mcg by mouth daily. AM  . [DISCONTINUED] Multiple Vitamin (MULTIVITAMIN) tablet Take 1 tablet by mouth daily.   No facility-administered encounter medications on file as of 04/29/2017.     Allergy: No Known Allergies  Social Hx:   Social History   Social History  . Marital status:  Married    Spouse name: N/A  . Number of children: N/A  . Years of education: N/A   Occupational History  . Not on file.   Social History Main Topics  . Smoking status: Former Games developer  . Smokeless tobacco: Never Used     Comment: stopped smoking 1 week ago  . Alcohol use Yes     Comment: occasionally  . Drug use: No  . Sexual activity: Not on file   Other Topics Concern  . Not on file   Social History Narrative  . No narrative on file    Past  Surgical Hx:  Past Surgical History:  Procedure Laterality Date  . ABDOMINAL HYSTERECTOMY  02/18/2003   ex. lap., lysis of adhesions, TAH, right salpingo-oophorectomy  . BREAST BIOPSY  08/01/2010   right  . BREAST LUMPECTOMY  05/30/2007   exc. left breast papilloma  . BREAST REDUCTION SURGERY  10/31/2011   Procedure: MAMMARY REDUCTION  (BREAST);  Surgeon: Karie Fetch, MD;  Location: [facility];  Service: Plastics;  Laterality: Bilateral;  . CESAREAN SECTION  1998  . HEMORROIDECTOMY  2003  . TEMPOROMANDIBULAR JOINT SURGERY     x 2    Past Medical Hx:  Past Medical History:  Diagnosis Date  . Dermatitis 10/25/2011   beneath breasts  . Hypothyroidism   . Macromastia 10/2011  . Seasonal allergies   . TMJ (dislocation of temporomandibular joint)     Past Gynecological History:   No LMP recorded. Patient has had a hysterectomy.  Family Hx: History reviewed. No pertinent family history.  Review of Systems:  Constitutional  Feels well,    ENT Normal appearing ears and nares bilaterally Skin/Breast  No rash, sores, jaundice, itching, dryness Cardiovascular  No chest pain, shortness of breath, or edema  Pulmonary  No cough or wheeze.  Gastro Intestinal  No nausea, vomitting, or diarrhoea. No bright red blood per rectum, no abdominal pain, change in bowel movement, or constipation.  Genito Urinary  No frequency, urgency, dysuria, + pelvic pain Musculo Skeletal  + back pain Neurologic  No weakness, numbness, change in gait,  Psychology  No depression, anxiety, insomnia.   Vitals:  Blood pressure 133/77, pulse 79, temperature 98.7 F (37.1 C), temperature source Oral, resp. rate 20, height  (1.626 m), weight 149 lb 6.4 oz (67.8 kg), SpO2 100 %.  Physical Exam: WD in NAD Neck  Supple NROM, without any enlargements.  Lymph Node Survey No cervical supraclavicular or inguinal adenopathy Cardiovascular  Pulse normal rate, regularity and rhythm.  S1 and S2 normal.  Lungs  Clear to auscultation bilateraly, without wheezes/crackles/rhonchi. Good air movement.  Skin  No rash/lesions/breakdown  Psychiatry  Alert and oriented to person, place, and time  Abdomen  Normoactive bowel sounds, abdomen soft, non-tender and nonobese without evidence of hernia.  Back No CVA tenderness Genito Urinary  Vulva/vagina: Normal external female genitalia.   No lesions. No discharge or bleeding.  Bladder/urethra:  No lesions or masses, well supported bladder  Vagina: normal, cuff smooth. No masses felt.  Rectal  Good tone, no masses no cul de sac nodularity.  Extremities  No bilateral cyanosis, clubbing or edema.   Quinn Axe, MD  04/29/2017, 5:09 PM

## 2017-05-07 NOTE — Op Note (Signed)
OPERATIVE NOTE  Date: 05/07/17  Preoperative Diagnosis: left ovarian cyst   Postoperative Diagnosis:  Left pelvic peritoneal inclusion cyst, dense pelvic adhesions, left ovarian cyst.  Procedure(s) Performed: Robotic-assisted laparoscopic lysis of adhesions, left salpingo-oophorectomy  Surgeon: Adolphus Birchwood, M.D.  Assistant Surgeon: Antionette Char M.D. (an MD assistant was necessary for tissue manipulation, management of robotic instrumentation, retraction and positioning due to the complexity of the case and hospital policies).   Anesthesia: Gen. endotracheal.  Specimens: left tube and ovary  Estimated Blood Loss: 25 mL. Blood Replacement: None  Complications: none  Indication for Procedure:  Left ovarian cyst on MRI and [place]  Operative Findings: dense adhesions between omentum and vaginal cuff and sigmoid colon. Dense adhesions between sigmoid colon and left ovary. Left ovary densely adherent to the left ovarian fossa and sidewall with a 2cm cyst on the ovary. The cyst seen on imaging was not an ovarian cyst, but rather a peritoneal inclusion cyst made in the space between the left ovary and colon. This was eliminated when the adhesions between the colon and ovary were taken down.  Procedure: The patient's taken to the operating room and placed under general endotracheal anesthesia testing difficulty. She is placed in a dorsolithotomy position and cervical acromial pad was placed. The arms were tucked with care taken to pad the olecranon process. And prepped and draped in usual sterile fashion. A uterine manipulator (zumi) was placed vaginally. A 5mm incision was made in the left upper quadrant palmer's point and a 5 mm Optiview trocar used to enter the abdomen under direct visualization. With entry into the abdomen and then maintenance of 15 mm of mercury the patient was placed in Trendelenburg position. An incision was made in the umbilicus and a 10mm trochar was placed through this  site. Two incisions were made lateral to the umbilical incision in the left and right abdomen measuring 8mm. These incisions were made approximately 10 cm lateral to the umbilical incision. 8 mm robotic trochars were inserted. The robot was docked.  The abdomen was inspected as was the pelvis.   For 60 minutes sharp adhesiolysis was performed to separate the omentum from the vaginal cuff and the sigmoid colon. The sigmoid colon was then sharply and carefully dissected off the left ovary. In doing so, a left pelvic peritoneal inclusion cyst was identified which spontaneously drained upon separation of the sigmoid from the ovary.  The ovary was splayed across the left pelvic sidewall and contained a 2cm cyst. Given the extreme adhesive disease, history of endometriosis, and abnormal appearing ovary, a decision was made to proceed with oophorectomy. An incision was made on the right pelvic side wall peritoneum parallel to the IP ligament and the retroperitoneal space entered. The right ureter was identified and the para-rectal space was developed. The left peritoneum and the side wall was incised, and the retroperitoneal space entered. The left ureter was identified and the left pararectal space was developed. The utero-ovarian ligament was skeletonized cauterized and transected. The left ovary was carefully dissected off of the sidewall and underlying ureter. A sponge stick was placed in the vagina to define the vagina and bladder and assist in dissecting the ovary from the vaginal cuff. The left adnexa was placed in an Endo Catch bag.    Several areas of ooze were appreciated on the anterior wall of the sigmoid colon where it had been sharply detached from the ovary. This was made hemostatic with interruped 3-0 vicryl. No breaches in the bowel wall were  seen with close inspection.The abdomen was copiously irrigated and drained and all operative sites inspected and hemostasis was assured. Floseal was used to  reinforce hemostasis on raw areas.  The robot was undocked. The contents of the left Endo Catch bag were first aspirated and then morcellated to facilitate removal from the abdominal cavity through the LUQ incision. In a similar fashion the contents of the right Endo Catch bag or morcellated to facilitate removal from the abdominal cavity.  The ports were all remove. The fascial closure at the umbilical incision and left upper quadrant port was made with 0 Vicryl.  All incisions were closed with a running subcuticular Monocryl suture. Dermabond was applied. Sponge, lap and needle counts were correct x 3.    The patient had sequential compression devices for VTE prophylaxis.         Disposition: PACU          Condition: stable  Quinn Axe, MD

## 2017-05-07 NOTE — Anesthesia Procedure Notes (Signed)
Procedure Name: Intubation Performed by: Gean Maidens Pre-anesthesia Checklist: Patient identified Patient Re-evaluated:Patient Re-evaluated prior to induction Oxygen Delivery Method: Circle system utilized Preoxygenation: Pre-oxygenation with 100% oxygen Induction Type: IV induction Ventilation: Mask ventilation without difficulty Laryngoscope Size: Mac and 4 Grade View: Grade II Tube type: Oral Tube size: 7.0 mm Number of attempts: 1 Airway Equipment and Method: Stylet Placement Confirmation: ETT inserted through vocal cords under direct vision,  positive ETCO2,  CO2 detector and breath sounds checked- equal and bilateral Secured at: 21 cm Tube secured with: Tape Dental Injury: Teeth and Oropharynx as per pre-operative assessment  Difficulty Due To: Difficulty was unanticipated and Difficult Airway- due to limited oral opening

## 2017-05-07 NOTE — Anesthesia Postprocedure Evaluation (Signed)
Anesthesia Post Note  Patient: Suzanne Trujillo  Procedure(s) Performed: ROBOTIC ASSISTED LYSIS OF ADHESIONS AND LEFT SALPINGECTOMY OOPHORECTOMY (Left )     Patient location during evaluation: PACU Anesthesia Type: General Level of consciousness: awake and alert Pain management: pain level controlled Vital Signs Assessment: post-procedure vital signs reviewed and stable Respiratory status: spontaneous breathing, nonlabored ventilation, respiratory function stable and patient connected to nasal cannula oxygen Cardiovascular status: blood pressure returned to baseline and stable Postop Assessment: no apparent nausea or vomiting Anesthetic complications: no    Last Vitals:  Vitals:   05/07/17 1315 05/07/17 1330  BP: 101/64 103/72  Pulse: 70 69  Resp: (!) 9 12  Temp:  36.4 C  SpO2: 92% 93%    Last Pain:  Vitals:   05/07/17 1330  TempSrc:   PainSc: 4                  Omarian Jaquith S

## 2017-05-07 NOTE — Discharge Instructions (Signed)
05/07/2017  Return to work: 4 weeks  Activity: 1. Be up and out of the bed during the day.  Take a nap if needed.  You may walk up steps but be careful and use the hand rail.  Stair climbing will tire you more than you think, you may need to stop part way and rest.   2. No lifting or straining for 6 weeks.  3. No driving for 1 weeks.  Do Not drive if you are taking narcotic pain medicine.  4. Shower daily.  Use soap and water on your incision and pat dry; don't rub.   5. No sexual activity and nothing in the vagina for 4 weeks.  Medications:  - Take ibuprofen and tylenol first line for pain control. Take these regularly (every 6 hours) to decrease the build up of pain.  - If necessary, for severe pain not relieved by ibuprofen, take percocet.  - While taking percocet you should take sennakot every night to reduce the likelihood of constipation. If this causes diarrhea, stop its use.  Diet: 1. Low sodium Heart Healthy Diet is recommended.  2. It is safe to use a laxative if you have difficulty moving your bowels.   Wound Care: 1. Keep clean and dry.  Shower daily.  Reasons to call the Doctor:   Fever - Oral temperature greater than 100.4 degrees Fahrenheit  Foul-smelling vaginal discharge  Difficulty urinating  Nausea and vomiting  Increased pain at the site of the incision that is unrelieved with pain medicine.  Difficulty breathing with or without chest pain  New calf pain especially if only on one side  Sudden, continuing increased vaginal bleeding with or without clots.   Follow-up: 1. See Adolphus Birchwood in 3-4 weeks.  Contacts: For questions or concerns you should contact:  Dr. Adolphus Birchwood at [phone] After hours and on week-ends call [phone] and ask to speak to the physician on call for Gynecologic Oncology

## 2017-05-07 NOTE — Transfer of Care (Signed)
Immediate Anesthesia Transfer of Care Note  Patient: Suzanne Trujillo  Procedure(s) Performed: ROBOTIC ASSISTED LYSIS OF ADHESIONS AND LEFT SALPINGECTOMY OOPHORECTOMY (Left )  Patient Location: PACU  Anesthesia Type:General  Level of Consciousness: awake, alert  and oriented  Airway & Oxygen Therapy: Patient Spontanous Breathing and Patient connected to face mask oxygen  Post-op Assessment: Report given to RN and Post -op Vital signs reviewed and stable  Post vital signs: Reviewed and stable  Last Vitals:  Vitals:   05/07/17 0726  BP: 127/82  Pulse: 70  Resp: 16  Temp: 36.8 C  SpO2: 99%    Last Pain:  Vitals:   05/07/17 0756  TempSrc:   PainSc: 5       Patients Stated Pain Goal: 4 (05/07/17 0756)  Complications: No apparent anesthesia complications

## 2017-05-07 NOTE — Interval H&P Note (Signed)
History and Physical Interval Note:  05/07/2017 1:01 PM  Suzanne Trujillo  has presented today for surgery, with the diagnosis of LEFT OVARIAN CYST  The various methods of treatment have been discussed with the patient and family. After consideration of risks, benefits and other options for treatment, the patient has consented to  Procedure(s): ROBOTIC ASSISTED LYSIS OF ADHESIONS AND LEFT SALPINGECTOMY OOPHORECTOMY (Left) as a surgical intervention .  The patient's history has been reviewed, patient examined, no change in status, stable for surgery.  I have reviewed the patient's chart and labs.  Questions were answered to the patient's satisfaction.     Suzanne Trujillo

## 2017-05-07 NOTE — Anesthesia Preprocedure Evaluation (Signed)
Anesthesia Evaluation  Patient identified by MRN, date of birth, ID band Patient awake    Reviewed: Allergy & Precautions, NPO status , Patient's Chart, lab work & pertinent test results  Airway Mallampati: II  TM Distance: >3 FB Neck ROM: Full    Dental   TMJ L side dislocates:   Pulmonary Current Smoker,    Pulmonary exam normal breath sounds clear to auscultation       Cardiovascular negative cardio ROS Normal cardiovascular exam Rhythm:Regular Rate:Normal     Neuro/Psych negative neurological ROS  negative psych ROS   GI/Hepatic negative GI ROS, Neg liver ROS,   Endo/Other  Hypothyroidism   Renal/GU negative Renal ROS  negative genitourinary   Musculoskeletal negative musculoskeletal ROS (+)   Abdominal   Peds negative pediatric ROS (+)  Hematology negative hematology ROS (+)   Anesthesia Other Findings   Reproductive/Obstetrics negative OB ROS                             Anesthesia Physical Anesthesia Plan  ASA: II  Anesthesia Plan: General   Post-op Pain Management:    Induction: Intravenous  PONV Risk Score and Plan: 2 and Ondansetron and Dexamethasone  Airway Management Planned: Oral ETT  Additional Equipment:   Intra-op Plan:   Post-operative Plan: Extubation in OR  Informed Consent: I have reviewed the patients History and Physical, chart, labs and discussed the procedure including the risks, benefits and alternatives for the proposed anesthesia with the patient or authorized representative who has indicated his/her understanding and acceptance.   Dental advisory given  Plan Discussed with: CRNA and Surgeon  Anesthesia Plan Comments:         Anesthesia Quick Evaluation

## 2017-05-07 NOTE — Telephone Encounter (Signed)
Scheduled post op appt, patient will receive appt on discharge summary.

## 2017-05-09 ENCOUNTER — Telehealth: Payer: Self-pay | Admitting: Gynecologic Oncology

## 2017-05-09 NOTE — Telephone Encounter (Signed)
Called patient with final path results.  All questions answered.  Follow up appt given.  No concerns voiced.  She is going to call the office when she needs a return to work note.

## 2017-05-15 ENCOUNTER — Telehealth: Payer: Self-pay | Admitting: *Deleted

## 2017-05-15 NOTE — Telephone Encounter (Signed)
Returned the patient's call and moved appt from the afternoon to the morning on October 29th.

## 2017-05-15 NOTE — Telephone Encounter (Signed)
Attempted to contact the patient to reschedule the post op appt for October 29th . Appt needs to move to the morning due to Dr. Andrey Farmer meeting in the afternoon.

## 2017-05-21 ENCOUNTER — Telehealth: Payer: Self-pay

## 2017-05-21 NOTE — Telephone Encounter (Signed)
Faxed Dr. Oliver Hum consult not 04-29-17; Op note and path from 05-07-17 to Dr. Aldona Bar for office visit 05-28-17. Pt and Morrie Sheldon at [facility] awre of records being faxed.

## 2017-05-28 ENCOUNTER — Other Ambulatory Visit: Payer: Self-pay | Admitting: Family Medicine

## 2017-05-28 DIAGNOSIS — Z1231 Encounter for screening mammogram for malignant neoplasm of breast: Secondary | ICD-10-CM

## 2017-06-03 ENCOUNTER — Ambulatory Visit: Payer: Commercial Managed Care - PPO | Attending: Gynecologic Oncology | Admitting: Gynecologic Oncology

## 2017-06-03 ENCOUNTER — Ambulatory Visit: Payer: Commercial Managed Care - PPO

## 2017-06-03 ENCOUNTER — Encounter: Payer: Self-pay | Admitting: Gynecologic Oncology

## 2017-06-03 VITALS — BP 136/80 | HR 106 | Temp 97.7°F | Resp 20 | Ht 64.0 in | Wt 145.9 lb

## 2017-06-03 DIAGNOSIS — N62 Hypertrophy of breast: Secondary | ICD-10-CM | POA: Diagnosis not present

## 2017-06-03 DIAGNOSIS — F17210 Nicotine dependence, cigarettes, uncomplicated: Secondary | ICD-10-CM | POA: Insufficient documentation

## 2017-06-03 DIAGNOSIS — Z90710 Acquired absence of both cervix and uterus: Secondary | ICD-10-CM | POA: Diagnosis not present

## 2017-06-03 DIAGNOSIS — E8941 Symptomatic postprocedural ovarian failure: Secondary | ICD-10-CM | POA: Diagnosis not present

## 2017-06-03 DIAGNOSIS — R1900 Intra-abdominal and pelvic swelling, mass and lump, unspecified site: Secondary | ICD-10-CM

## 2017-06-03 DIAGNOSIS — Z79899 Other long term (current) drug therapy: Secondary | ICD-10-CM | POA: Insufficient documentation

## 2017-06-03 DIAGNOSIS — Z9889 Other specified postprocedural states: Secondary | ICD-10-CM | POA: Insufficient documentation

## 2017-06-03 DIAGNOSIS — F419 Anxiety disorder, unspecified: Secondary | ICD-10-CM | POA: Insufficient documentation

## 2017-06-03 DIAGNOSIS — Z90722 Acquired absence of ovaries, bilateral: Secondary | ICD-10-CM | POA: Insufficient documentation

## 2017-06-03 DIAGNOSIS — K668 Other specified disorders of peritoneum: Secondary | ICD-10-CM | POA: Diagnosis not present

## 2017-06-03 DIAGNOSIS — E039 Hypothyroidism, unspecified: Secondary | ICD-10-CM | POA: Insufficient documentation

## 2017-06-03 DIAGNOSIS — Z8543 Personal history of malignant neoplasm of ovary: Secondary | ICD-10-CM | POA: Diagnosis not present

## 2017-06-03 DIAGNOSIS — N951 Menopausal and female climacteric states: Secondary | ICD-10-CM | POA: Diagnosis not present

## 2017-06-03 DIAGNOSIS — N83202 Unspecified ovarian cyst, left side: Secondary | ICD-10-CM

## 2017-06-03 NOTE — Patient Instructions (Signed)
Please contact Dr Andrey Farmerossi at [phone] if you have any concerns about your surgery. Please contact Dr Aldona BarWein to discuss hormone use and symptoms of menopause. Dr Aldona BarWein will continue to see you annually for well woman checks.

## 2017-06-03 NOTE — Progress Notes (Signed)
Consult Note: Gyn-Onc  Consult was requested by Dr. Aldona Bar for the evaluation of Suzanne Trujillo 46 y.o. female  CC:  Chief Complaint  Patient presents with  . Pelvic mass    Assessment/Plan:  Suzanne Trujillo  is a 46 y.o.  year old with a history of a left sided peritoneal inclusion cyst and benign ovarian cyst. S/p robotic assisted LSO and LOA on 05/07/17. Symptoms resolved.  She has some surgical menopause symptoms. I encouraged her to discuss these with her OBGYN, Dr Aldona Bar.  I will see her back on a prn basis.   HPI: Suzanne Trujillo is a 46 year old parous woman who is seen in consultation at the request of Dr Aldona Bar for a left ovarian vs fallopian tube cyst.  The patient has a remote history of endometriosis and cervical dysplasia and underwent a TAH, RSO with Dr Aldona Bar for this at age 83.  1 year ago she began experiencing intermittent severe low back pains and intermittent left sciatic symptoms. The onset came after an injury during exercise. The pain is worse with twisting and turning and lumbar MRI on 04/10/17 showed a 7cm fluid collection in the left hemipelvis.   A TVUS was performed on 04/16/17 and showed a complex mass in the left adnexa measuring 6.6cm it is lateral to the ovary and is anechoic. Is may be a fallopian tube. The left ovary appears lubluated with an area of normal ovarian tissue then a 3.6cm complex mass (likely hemorrhagic cyst).  CA 125 was normal on 04/16/17 at 9.8.  The patient's abdominal surgical history includes a cesarean and a TAH, RSO.  She takes percocet intermittently for the pain but not every day.   Interval Hx: On 06/03/17 she underwent a robotic assisted lysis of adhesions and LSO. Intraoperative findings were significant for colonic adhesions to the pelvis causing an inclusion cyst. There was a 2cm cyst on the left ovary.  Since surgery her pain has completely resolved. She has menopausal symptoms not completely resolved by oral  premarin.  Current Meds:  Outpatient Encounter Prescriptions as of 06/03/2017  Medication Sig  . ALPRAZolam (XANAX) 0.5 MG tablet Take 0.25-0.5 mg by mouth 3 (three) times daily as needed for anxiety.  . docusate sodium (COLACE) 100 MG capsule Take 100 mg by mouth daily as needed for mild constipation.   Marland Kitchen estradiol (CLIMARA - DOSED IN MG/24 HR) 0.1 mg/24hr patch Place 1 patch (0.1 mg total) onto the skin once a week.  Marland Kitchen ibuprofen (ADVIL,MOTRIN) 200 MG tablet Take 400-600 mg by mouth every 8 (eight) hours as needed for mild pain or moderate pain.  . meloxicam (MOBIC) 7.5 MG tablet Take 7.5 mg by mouth daily as needed for pain.  Marland Kitchen oxyCODONE-acetaminophen (PERCOCET/ROXICET) 5-325 MG tablet Take 1-2 tablets by mouth every 4 (four) hours as needed for severe pain.  Marland Kitchen senna-docusate (SENOKOT-S) 8.6-50 MG tablet Take 1 tablet by mouth daily.  . ustekinumab (STELARA) 45 MG/0.5ML SOSY injection Inject 45 mg into the skin. Injection is every 12 weeks   No facility-administered encounter medications on file as of 06/03/2017.     Allergy: No Known Allergies  Social Hx:   Social History   Social History  . Marital status: Married    Spouse name: N/A  . Number of children: N/A  . Years of education: N/A   Occupational History  . Not on file.   Social History Main Topics  . Smoking status: Current Every Day Smoker  Packs/day: 0.25    Years: 30.00  . Smokeless tobacco: Never Used  . Alcohol use Yes     Comment: occasionally  . Drug use: No  . Sexual activity: Not on file   Other Topics Concern  . Not on file   Social History Narrative  . No narrative on file    Past Surgical Hx:  Past Surgical History:  Procedure Laterality Date  . ABDOMINAL HYSTERECTOMY  02/18/2003   ex. lap., lysis of adhesions, TAH, right salpingo-oophorectomy  . BREAST BIOPSY  08/01/2010   right  . BREAST LUMPECTOMY  05/30/2007   exc. left breast papilloma  . BREAST REDUCTION SURGERY  10/31/2011    Procedure: MAMMARY REDUCTION  (BREAST);  Surgeon: Karie FetchMary A Contogiannis, MD;  Location: [facility];  Service: Plastics;  Laterality: Bilateral;  . CESAREAN SECTION  1998  . HEMORROIDECTOMY  2003  . 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 ROBOTIC ASSISTED LAPAROSCOPIC OVARIAN CYSTECTOMY Left 05/07/2017   Procedure: ROBOTIC ASSISTED LYSIS OF ADHESIONS AND LEFT SALPINGECTOMY OOPHORECTOMY;  Surgeon: Adolphus Birchwoodossi, Maddilyn Campus, MD;  Location: [facility];  Service: Gynecology;  Laterality: Left;  . TEMPOROMANDIBULAR JOINT SURGERY     x 2    Past Medical Hx:  Past Medical History:  Diagnosis Date  . Anxiety   . Back pain   . Dermatitis 10/25/2011   beneath breasts  . Hypothyroidism    co current meds for  . Left ovarian cyst   . Macromastia 10/2011  . Pneumonia yrs ago  . Seasonal allergies   . 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 Patient has had a hysterectomy.  Family Hx: History reviewed. No pertinent family history.  Review of Systems:  Constitutional  Feels well,    ENT Normal appearing ears and nares bilaterally Skin/Breast  No rash, sores, jaundice, itching, dryness Cardiovascular  No chest pain, shortness of breath, or edema  Pulmonary  No cough or wheeze.  Gastro Intestinal  No nausea, vomitting, or diarrhoea. No bright red blood per rectum, no abdominal pain, change in bowel movement, or constipation.  Genito Urinary  No frequency, urgency, dysuria, + pelvic pain Musculo Skeletal  + back pain Neurologic  No weakness, numbness, change in gait,  Psychology  No depression, anxiety, insomnia.   Vitals:  Blood pressure 136/80, pulse (!) 106, temperature 97.7 F (36.5 C), temperature source Oral, resp. rate 20, height 5\' 4"  (1.626 m), weight 145 lb 14.4 oz (66.2 kg), SpO2 100 %.  Physical Exam: WD in NAD Neck  Supple NROM, without any enlargements.  Lymph Node  Survey No cervical supraclavicular or inguinal adenopathy Cardiovascular  Pulse normal rate, regularity and rhythm. S1 and S2 normal.  Lungs  Clear to auscultation bilateraly, without wheezes/crackles/rhonchi. Good air movement.  Skin  No rash/lesions/breakdown  Psychiatry  Alert and oriented to person, place, and time  Abdomen  Normoactive bowel sounds, abdomen soft, non-tender and nonobese without evidence of hernia. Incisions well healed. LUQ incision slightly inflamed but not infected. Back No CVA tenderness Genito Urinary  deferred Extremities  No bilateral cyanosis, clubbing or edema.   Quinn Axeossi, Hamlet Lasecki Caroline, MD  06/03/2017, 12:15 PM

## 2017-07-01 ENCOUNTER — Ambulatory Visit
Admission: RE | Admit: 2017-07-01 | Discharge: 2017-07-01 | Disposition: A | Discharge status: Home / Self Care | Payer: Commercial Managed Care - PPO | Source: Ambulatory Visit | Attending: Family Medicine | Admitting: Family Medicine

## 2017-07-01 DIAGNOSIS — Z1231 Encounter for screening mammogram for malignant neoplasm of breast: Secondary | ICD-10-CM

## 2018-06-13 ENCOUNTER — Other Ambulatory Visit: Payer: Self-pay | Admitting: Family Medicine

## 2018-06-13 DIAGNOSIS — Z1231 Encounter for screening mammogram for malignant neoplasm of breast: Secondary | ICD-10-CM

## 2018-07-28 ENCOUNTER — Ambulatory Visit: Payer: Commercial Managed Care - PPO

## 2018-10-09 ENCOUNTER — Ambulatory Visit
Admission: RE | Admit: 2018-10-09 | Discharge: 2018-10-09 | Disposition: A | Discharge status: Home / Self Care | Payer: Commercial Managed Care - PPO | Source: Ambulatory Visit | Attending: Family Medicine | Admitting: Family Medicine

## 2018-10-09 DIAGNOSIS — Z1231 Encounter for screening mammogram for malignant neoplasm of breast: Secondary | ICD-10-CM

## 2019-06-15 ENCOUNTER — Other Ambulatory Visit: Payer: Self-pay | Admitting: Family Medicine

## 2019-06-15 DIAGNOSIS — Z1231 Encounter for screening mammogram for malignant neoplasm of breast: Secondary | ICD-10-CM

## 2019-10-12 ENCOUNTER — Ambulatory Visit
Admission: RE | Admit: 2019-10-12 | Discharge: 2019-10-12 | Disposition: A | Discharge status: Home / Self Care | Payer: Commercial Managed Care - PPO | Source: Ambulatory Visit | Attending: Family Medicine | Admitting: Family Medicine

## 2019-10-12 ENCOUNTER — Other Ambulatory Visit: Payer: Self-pay

## 2019-10-12 DIAGNOSIS — Z1231 Encounter for screening mammogram for malignant neoplasm of breast: Secondary | ICD-10-CM

## 2020-11-17 ENCOUNTER — Other Ambulatory Visit: Payer: Self-pay | Admitting: Family Medicine

## 2020-11-17 DIAGNOSIS — Z1231 Encounter for screening mammogram for malignant neoplasm of breast: Secondary | ICD-10-CM

## 2021-01-09 ENCOUNTER — Other Ambulatory Visit: Payer: Self-pay

## 2021-01-09 ENCOUNTER — Ambulatory Visit
Admission: RE | Admit: 2021-01-09 | Discharge: 2021-01-09 | Disposition: A | Discharge status: Home / Self Care | Payer: Commercial Managed Care - PPO | Source: Ambulatory Visit | Attending: Family Medicine | Admitting: Family Medicine

## 2021-01-09 DIAGNOSIS — Z1231 Encounter for screening mammogram for malignant neoplasm of breast: Secondary | ICD-10-CM

## 2022-11-26 ENCOUNTER — Other Ambulatory Visit: Payer: Self-pay | Admitting: Orthopedic Surgery

## 2023-01-21 ENCOUNTER — Encounter (HOSPITAL_COMMUNITY): Payer: Self-pay

## 2023-01-21 NOTE — Pre-Procedure Instructions (Signed)
Surgical Instructions   Your procedure is scheduled on Wednesday, June 26th. Report to [facility] Main Entrance "A" at 05:30 A.M., then check in with the Admitting office. Any questions or running late day of surgery: call [phone]  Questions prior to your surgery date: call [phone], Monday-Friday, 8am-4pm. If you experience any cold or flu symptoms such as cough, fever, chills, shortness of breath, etc. between now and your scheduled surgery, please notify us at the above number.     Remember:  Do not eat after midnight the night before your surgery  You may drink clear liquids until 04:30 AM the morning of your surgery.   Clear liquids allowed are: Water, Non-Citrus Juices (without pulp), Carbonated Beverages, Clear Tea, Black Coffee Only (NO MILK, CREAM OR POWDERED CREAMER of any kind), and Gatorade.   Patient Instructions  The night before surgery:  No food after midnight. ONLY clear liquids after midnight  The day of surgery (if you do NOT have diabetes):  Drink ONE (1) Pre-Surgery Clear Ensure by 04:30 AM the morning of surgery. Drink in one sitting. Do not sip.  This drink was given to you during your hospital  pre-op appointment visit.  Nothing else to drink after completing the  Pre-Surgery Clear Ensure.          If you have questions, please contact your surgeon's office.     Take these medicines the morning of surgery with A SIP OF WATER  sertraline (ZOLOFT)     May take these medicines IF NEEDED: ALPRAZolam (XANAX)  methocarbamol (ROBAXIN)  pregabalin (LYRICA)  traMADol (ULTRAM)    One week prior to surgery, STOP taking any Aspirin (unless otherwise instructed by your surgeon) Aleve, Naproxen, Ibuprofen, Motrin, meloxicam (MOBIC), Advil, Goody's, BC's, all herbal medications, fish oil, and non-prescription vitamins.                     Do NOT Smoke (Tobacco/Vaping) for 24 hours prior to your procedure.  If you use a CPAP at night, you may bring  your mask/headgear for your overnight stay.   You will be asked to remove any contacts, glasses, piercing's, hearing aid's, dentures/partials prior to surgery. Please bring cases for these items if needed.    Patients discharged the day of surgery will not be allowed to drive home, and someone needs to stay with them for 24 hours.  SURGICAL WAITING ROOM VISITATION Patients may have no more than 2 support people in the waiting area - these visitors may rotate.   Pre-op nurse will coordinate an appropriate time for 1 ADULT support person, who may not rotate, to accompany patient in pre-op.  Children under the age of 80 must have an adult with them who is not the patient and must remain in the main waiting area with an adult.  If the patient needs to stay at the hospital during part of their recovery, the visitor guidelines for inpatient rooms apply.  Please refer to the [facility] website for the visitor guidelines for any additional information.   If you received a COVID test during your pre-op visit  it is requested that you wear a mask when out in public, stay away from anyone that may not be feeling well and notify your surgeon if you develop symptoms. If you have been in contact with anyone that has tested positive in the last 10 days please notify you surgeon.      Pre-operative 5 CHG Bath Instructions   You  can play a key role in reducing the risk of infection after surgery. Your skin needs to be as free of germs as possible. You can reduce the number of germs on your skin by washing with CHG (chlorhexidine gluconate) soap before surgery. CHG is an antiseptic soap that kills germs and continues to kill germs even after washing.   DO NOT use if you have an allergy to chlorhexidine/CHG or antibacterial soaps. If your skin becomes reddened or irritated, stop using the CHG and notify one of our RNs at [phone].   Please shower with the CHG soap starting 4 days before surgery using  the following schedule:     Please keep in mind the following:  DO NOT shave, including legs and underarms, starting the day of your first shower.   You may shave your face at any point before/day of surgery.  Place clean sheets on your bed the day you start using CHG soap. Use a clean washcloth (not used since being washed) for each shower. DO NOT sleep with pets once you start using the CHG.   CHG Shower Instructions:  If you choose to wash your hair and private area, wash first with your normal shampoo/soap.  After you use shampoo/soap, rinse your hair and body thoroughly to remove shampoo/soap residue.  Turn the water OFF and apply about 3 tablespoons (45 ml) of CHG soap to a CLEAN washcloth.  Apply CHG soap ONLY FROM YOUR NECK DOWN TO YOUR TOES (washing for 3-5 minutes)  DO NOT use CHG soap on face, private areas, open wounds, or sores.  Pay special attention to the area where your surgery is being performed.  If you are having back surgery, having someone wash your back for you may be helpful. Wait 2 minutes after CHG soap is applied, then you may rinse off the CHG soap.  Pat dry with a clean towel  Put on clean clothes/pajamas   If you choose to wear lotion, please use ONLY the CHG-compatible lotions on the back of this paper.   Additional instructions for the day of surgery: DO NOT APPLY any lotions, deodorants, cologne, or perfumes.   Do not bring valuables to the hospital. [facility] is not responsible for any belongings/valuables. Do not wear nail polish, gel polish, artificial nails, or any other type of covering on natural nails (fingers and toes) Do not wear jewelry or makeup Put on clean/comfortable clothes.  Please brush your teeth.  Ask your nurse before applying any prescription medications to the skin.     CHG Compatible Lotions   Aveeno Moisturizing lotion  Cetaphil Moisturizing Cream  Cetaphil Moisturizing Lotion  Clairol Herbal Essence Moisturizing  Lotion, Dry Skin  Clairol Herbal Essence Moisturizing Lotion, Extra Dry Skin  Clairol Herbal Essence Moisturizing Lotion, Normal Skin  Curel Age Defying Therapeutic Moisturizing Lotion with Alpha Hydroxy  Curel Extreme Care Body Lotion  Curel Soothing Hands Moisturizing Hand Lotion  Curel Therapeutic Moisturizing Cream, Fragrance-Free  Curel Therapeutic Moisturizing Lotion, Fragrance-Free  Curel Therapeutic Moisturizing Lotion, Original Formula  Eucerin Daily Replenishing Lotion  Eucerin Dry Skin Therapy Plus Alpha Hydroxy Crme  Eucerin Dry Skin Therapy Plus Alpha Hydroxy Lotion  Eucerin Original Crme  Eucerin Original Lotion  Eucerin Plus Crme Eucerin Plus Lotion  Eucerin TriLipid Replenishing Lotion  Keri Anti-Bacterial Hand Lotion  Keri Deep Conditioning Original Lotion Dry Skin Formula Softly Scented  Keri Deep Conditioning Original Lotion, Fragrance Free Sensitive Skin Formula  Keri Lotion Fast Absorbing Fragrance Free Sensitive Skin  Formula  Keri Lotion Fast Absorbing Softly Scented Dry Skin Formula  Keri Original Lotion  Keri Skin Renewal Lotion Keri Silky Smooth Lotion  Keri Silky Smooth Sensitive Skin Lotion  Nivea Body Creamy Conditioning Oil  Nivea Body Extra Enriched Lotion  Nivea Body Original Lotion  Nivea Body Sheer Moisturizing Lotion Nivea Crme  Nivea Skin Firming Lotion  NutraDerm 30 Skin Lotion  NutraDerm Skin Lotion  NutraDerm Therapeutic Skin Cream  NutraDerm Therapeutic Skin Lotion  ProShield Protective Hand Cream  Provon moisturizing lotion  Please read over the following fact sheets that you were given.

## 2023-01-21 NOTE — Progress Notes (Signed)
     Your surgery and Pre-Admission testing visit will be at [facility] located at [address], [place], [place] [address].  Please let all your doctors (i.e., Primary Care Physician, Cardiologist, Endocrinologist, Pulmonologist) know you are having surgery. You may need clearance for surgery. If you are on blood thinners, notify your surgeon and ask the doctor who prescribed them how long to hold them before surgery.  If you have had a heart test, such as an EKG, stress test, heart ultrasound, etc., or lab work performed outside of [place], please bring copies of these tests to your Pre-Admission testing, if possible.  These departments may contact you before the day of surgery:  Pre-Service Center - insurance/ billing: [phone] Pharmacy- to review your medications: [phone] Pre-Admission Testing- to set an appointment for your visit: [phone]  (Often, these numbers show up as "SPAM" on your phone)  The Pre-Admission Testing (PAT) visit focuses on Anesthesia for your upcoming surgery.  You do NOT need to fast; take your medications as usual. Please arrive 30 minutes early to allow for parking and admitting.  The visit may last up to an hour. Bring a photo ID and medical insurance card. Reschedule if you are sick. ([phone]) and please, NO children under age 16 at the visit.  During the PAT visit:  We will review your medical and surgical history.   You will receive pre-operative instructions, including the time of arrival at the hospital and surgical start time.  We will review what medication(s) you can take on the day of surgery.  After speaking with the nurse, you will have blood drawn and, if needed, a chest x-ray and EKG.  Most lab results from your doctor are good for 30 days, Hemoglobin A1C is good for 60 days. If you cannot talk to the Pharmacy, bring your medications or a list of them to the PST visit.   Infection control for the [facility] requires: All fingernail and toenail products should be removed before the day of surgery.  (SNS, Acrylic, Gel, Polish, Stickers, Press on, and Poly gel nails.)   Parking information:  Address: [facility] - [address], [place], [place] [address]  Please look for signs for entrance A off of [address]. Free valet parking is available Monday-Friday 05:30am-06:00pm     

## 2023-01-22 ENCOUNTER — Other Ambulatory Visit: Payer: Self-pay

## 2023-01-22 ENCOUNTER — Encounter (HOSPITAL_COMMUNITY): Payer: Self-pay

## 2023-01-22 ENCOUNTER — Encounter (HOSPITAL_COMMUNITY)
Admission: RE | Admit: 2023-01-22 | Discharge: 2023-01-22 | Disposition: A | Discharge status: Home / Self Care | Payer: Commercial Managed Care - PPO | Source: Ambulatory Visit | Attending: Orthopedic Surgery | Admitting: Orthopedic Surgery

## 2023-01-22 VITALS — BP 122/54 | HR 79 | Temp 98.4°F | Resp 18 | Ht 74.0 in | Wt 152.6 lb

## 2023-01-22 DIAGNOSIS — Z01818 Encounter for other preprocedural examination: Secondary | ICD-10-CM | POA: Diagnosis present

## 2023-01-22 HISTORY — DX: Unspecified osteoarthritis, unspecified site: M19.90

## 2023-01-22 LAB — SURGICAL PCR SCREEN
MRSA, PCR: NEGATIVE
Staphylococcus aureus: POSITIVE — AB

## 2023-01-22 LAB — CBC
HCT: 41 % (ref 36.0–46.0)
Hemoglobin: 13.7 g/dL (ref 12.0–15.0)
MCH: 30 pg (ref 26.0–34.0)
MCHC: 33.4 g/dL (ref 30.0–36.0)
MCV: 89.7 fL (ref 80.0–100.0)
Platelets: 344 10*3/uL (ref 150–400)
RBC: 4.57 MIL/uL (ref 3.87–5.11)
RDW: 12 % (ref 11.5–15.5)
WBC: 6.4 10*3/uL (ref 4.0–10.5)
nRBC: 0 % (ref 0.0–0.2)

## 2023-01-22 LAB — TYPE AND SCREEN
ABO/RH(D): A POS
Antibody Screen: NEGATIVE

## 2023-01-22 NOTE — Progress Notes (Signed)
PCP - Dr. Cheri Rous Cardiologist - denies  PPM/ICD - denies   Chest x-ray - denies EKG - 8 years ago per pt, at [facility], normal per pt Stress Test - denies ECHO - denies Cardiac Cath - denies  Sleep Study - denies   DM- denies  ASA/Blood Thinner Instructions: n/a   ERAS Protcol - yes PRE-SURGERY Ensure given at PAT  COVID TEST- n/a   Anesthesia review: no  Patient denies shortness of breath, fever, cough and chest pain at PAT appointment   All instructions explained to the patient, with a verbal understanding of the material. Patient agrees to go over the instructions while at home for a better understanding.  The opportunity to ask questions was provided.

## 2023-01-30 ENCOUNTER — Other Ambulatory Visit: Payer: Self-pay

## 2023-01-30 ENCOUNTER — Ambulatory Visit (HOSPITAL_COMMUNITY)
Admission: RE | Admit: 2023-01-30 | Discharge: 2023-01-30 | Disposition: A | Discharge status: Home / Self Care | Payer: Commercial Managed Care - PPO | Source: Ambulatory Visit | Attending: Orthopedic Surgery | Admitting: Orthopedic Surgery

## 2023-01-30 ENCOUNTER — Ambulatory Visit (HOSPITAL_COMMUNITY): Payer: Commercial Managed Care - PPO | Admitting: Certified Registered Nurse Anesthetist

## 2023-01-30 ENCOUNTER — Encounter (HOSPITAL_COMMUNITY): Payer: Self-pay | Admitting: Orthopedic Surgery

## 2023-01-30 ENCOUNTER — Ambulatory Visit (HOSPITAL_COMMUNITY): Payer: Commercial Managed Care - PPO

## 2023-01-30 ENCOUNTER — Ambulatory Visit (HOSPITAL_BASED_OUTPATIENT_CLINIC_OR_DEPARTMENT_OTHER): Payer: Commercial Managed Care - PPO | Admitting: Certified Registered Nurse Anesthetist

## 2023-01-30 ENCOUNTER — Encounter (HOSPITAL_COMMUNITY)
Admission: RE | Disposition: A | Discharge status: Home / Self Care | Payer: Self-pay | Source: Ambulatory Visit | Attending: Orthopedic Surgery

## 2023-01-30 DIAGNOSIS — G9520 Unspecified cord compression: Secondary | ICD-10-CM | POA: Diagnosis not present

## 2023-01-30 DIAGNOSIS — M5001 Cervical disc disorder with myelopathy,  high cervical region: Secondary | ICD-10-CM | POA: Diagnosis not present

## 2023-01-30 DIAGNOSIS — Z87891 Personal history of nicotine dependence: Secondary | ICD-10-CM

## 2023-01-30 DIAGNOSIS — M4802 Spinal stenosis, cervical region: Secondary | ICD-10-CM | POA: Diagnosis not present

## 2023-01-30 DIAGNOSIS — G9529 Other cord compression: Secondary | ICD-10-CM | POA: Insufficient documentation

## 2023-01-30 DIAGNOSIS — M5010 Cervical disc disorder with radiculopathy, unspecified cervical region: Secondary | ICD-10-CM

## 2023-01-30 DIAGNOSIS — F419 Anxiety disorder, unspecified: Secondary | ICD-10-CM | POA: Diagnosis not present

## 2023-01-30 DIAGNOSIS — M5412 Radiculopathy, cervical region: Secondary | ICD-10-CM | POA: Diagnosis present

## 2023-01-30 HISTORY — PX: ANTERIOR CERVICAL DECOMPRESSION/DISCECTOMY FUSION 4 LEVELS: SHX5556

## 2023-01-30 SURGERY — ANTERIOR CERVICAL DECOMPRESSION/DISCECTOMY FUSION 4 LEVELS
Anesthesia: General | Site: Spine Cervical

## 2023-01-30 MED ORDER — ROCURONIUM BROMIDE 10 MG/ML (PF) SYRINGE
PREFILLED_SYRINGE | INTRAVENOUS | Status: DC | PRN
  Administered 2023-01-30: 10 mg via INTRAVENOUS
  Administered 2023-01-30: 20 mg via INTRAVENOUS
  Administered 2023-01-30: 70 mg via INTRAVENOUS

## 2023-01-30 MED ORDER — LIDOCAINE 2% (20 MG/ML) 5 ML SYRINGE
INTRAMUSCULAR | Status: DC | PRN
  Administered 2023-01-30: 40 mg via INTRAVENOUS

## 2023-01-30 MED ORDER — CHLORHEXIDINE GLUCONATE 0.12 % MT SOLN
15.0000 mL | Freq: Once | OROMUCOSAL | Status: AC
  Administered 2023-01-30: 15 mL via OROMUCOSAL
  Filled 2023-01-30: qty 15

## 2023-01-30 MED ORDER — ORAL CARE MOUTH RINSE: 15.0000 mL | Freq: Once | OROMUCOSAL | Status: AC

## 2023-01-30 MED ORDER — POVIDONE-IODINE 7.5 % EX SOLN
Freq: Once | CUTANEOUS | Status: DC
  Filled 2023-01-30: qty 118

## 2023-01-30 MED ORDER — FENTANYL CITRATE (PF) 100 MCG/2ML IJ SOLN
INTRAMUSCULAR | Status: AC
  Filled 2023-01-30: qty 2

## 2023-01-30 MED ORDER — FENTANYL CITRATE (PF) 100 MCG/2ML IJ SOLN
25.0000 ug | INTRAMUSCULAR | Status: DC | PRN
  Administered 2023-01-30 (×2): 50 ug via INTRAVENOUS

## 2023-01-30 MED ORDER — BUPIVACAINE-EPINEPHRINE 0.25% -1:200000 IJ SOLN
INTRAMUSCULAR | Status: DC | PRN
  Administered 2023-01-30: 6 mL

## 2023-01-30 MED ORDER — BUPIVACAINE-EPINEPHRINE (PF) 0.25% -1:200000 IJ SOLN
INTRAMUSCULAR | Status: AC
  Filled 2023-01-30: qty 30

## 2023-01-30 MED ORDER — KETAMINE HCL 50 MG/5ML IJ SOSY
PREFILLED_SYRINGE | INTRAMUSCULAR | Status: AC
  Filled 2023-01-30: qty 5

## 2023-01-30 MED ORDER — DEXMEDETOMIDINE HCL IN NACL 80 MCG/20ML IV SOLN
INTRAVENOUS | Status: AC
  Filled 2023-01-30: qty 20

## 2023-01-30 MED ORDER — DEXMEDETOMIDINE HCL IN NACL 80 MCG/20ML IV SOLN
INTRAVENOUS | Status: DC | PRN
  Administered 2023-01-30 (×2): 8 ug via INTRAVENOUS
  Administered 2023-01-30: 4 ug via INTRAVENOUS

## 2023-01-30 MED ORDER — DEXAMETHASONE SODIUM PHOSPHATE 10 MG/ML IJ SOLN
INTRAMUSCULAR | Status: DC | PRN
  Administered 2023-01-30: 10 mg via INTRAVENOUS

## 2023-01-30 MED ORDER — 0.9 % SODIUM CHLORIDE (POUR BTL) OPTIME
TOPICAL | Status: DC | PRN
  Administered 2023-01-30: 1000 mL

## 2023-01-30 MED ORDER — PROPOFOL 500 MG/50ML IV EMUL
INTRAVENOUS | Status: DC | PRN
  Administered 2023-01-30: 15 ug/kg/min via INTRAVENOUS

## 2023-01-30 MED ORDER — DEXAMETHASONE SODIUM PHOSPHATE 10 MG/ML IJ SOLN
INTRAMUSCULAR | Status: AC
  Filled 2023-01-30: qty 1

## 2023-01-30 MED ORDER — PROPOFOL 10 MG/ML IV BOLUS
INTRAVENOUS | Status: DC | PRN
  Administered 2023-01-30 (×2): 30 mg via INTRAVENOUS
  Administered 2023-01-30: 110 mg via INTRAVENOUS
  Administered 2023-01-30: 30 mg via INTRAVENOUS

## 2023-01-30 MED ORDER — PHENYLEPHRINE HCL-NACL 20-0.9 MG/250ML-% IV SOLN
INTRAVENOUS | Status: DC | PRN
  Administered 2023-01-30: 50 ug/min via INTRAVENOUS
  Administered 2023-01-30: 25 ug/min via INTRAVENOUS

## 2023-01-30 MED ORDER — ACETAMINOPHEN 500 MG PO TABS: 1000.0000 mg | ORAL_TABLET | Freq: Once | ORAL | Status: DC | PRN

## 2023-01-30 MED ORDER — ACETAMINOPHEN 10 MG/ML IV SOLN
INTRAVENOUS | Status: AC
  Filled 2023-01-30: qty 100

## 2023-01-30 MED ORDER — ONDANSETRON HCL 4 MG/2ML IJ SOLN
INTRAMUSCULAR | Status: AC
  Filled 2023-01-30: qty 2

## 2023-01-30 MED ORDER — MIDAZOLAM HCL 2 MG/2ML IJ SOLN
INTRAMUSCULAR | Status: AC
  Filled 2023-01-30: qty 2

## 2023-01-30 MED ORDER — ACETAMINOPHEN 160 MG/5ML PO SOLN: 1000.0000 mg | Freq: Once | ORAL | Status: DC | PRN

## 2023-01-30 MED ORDER — MIDAZOLAM HCL 2 MG/2ML IJ SOLN
INTRAMUSCULAR | Status: DC | PRN
  Administered 2023-01-30: 2 mg via INTRAVENOUS

## 2023-01-30 MED ORDER — SUGAMMADEX SODIUM 200 MG/2ML IV SOLN
INTRAVENOUS | Status: DC | PRN
  Administered 2023-01-30: 200 mg via INTRAVENOUS

## 2023-01-30 MED ORDER — OXYCODONE HCL 5 MG PO TABS: 5.0000 mg | ORAL_TABLET | Freq: Once | ORAL | Status: DC | PRN

## 2023-01-30 MED ORDER — FENTANYL CITRATE (PF) 250 MCG/5ML IJ SOLN
INTRAMUSCULAR | Status: DC | PRN
  Administered 2023-01-30 (×7): 50 ug via INTRAVENOUS

## 2023-01-30 MED ORDER — ACETAMINOPHEN 10 MG/ML IV SOLN
INTRAVENOUS | Status: DC | PRN
  Administered 2023-01-30: 1000 mg via INTRAVENOUS

## 2023-01-30 MED ORDER — LACTATED RINGERS IV SOLN: INTRAVENOUS | Status: DC | PRN

## 2023-01-30 MED ORDER — ROCURONIUM BROMIDE 10 MG/ML (PF) SYRINGE
PREFILLED_SYRINGE | INTRAVENOUS | Status: AC
  Filled 2023-01-30: qty 10

## 2023-01-30 MED ORDER — THROMBIN 20000 UNITS EX SOLR
CUTANEOUS | Status: DC | PRN
  Administered 2023-01-30: 20000 [IU] via TOPICAL

## 2023-01-30 MED ORDER — HYDROCODONE-ACETAMINOPHEN 5-325 MG PO TABS: 1.0000 | ORAL_TABLET | Freq: Four times a day (QID) | ORAL | 0 refills | Status: AC | PRN

## 2023-01-30 MED ORDER — LACTATED RINGERS IV SOLN: INTRAVENOUS | Status: DC

## 2023-01-30 MED ORDER — METHOCARBAMOL 500 MG PO TABS: 500.0000 mg | ORAL_TABLET | Freq: Four times a day (QID) | ORAL | 2 refills | Status: AC | PRN

## 2023-01-30 MED ORDER — HEMOSTATIC AGENTS (NO CHARGE) OPTIME
TOPICAL | Status: DC | PRN
  Administered 2023-01-30: 1 via TOPICAL

## 2023-01-30 MED ORDER — FENTANYL CITRATE (PF) 250 MCG/5ML IJ SOLN
INTRAMUSCULAR | Status: AC
  Filled 2023-01-30: qty 5

## 2023-01-30 MED ORDER — SURGIFLO WITH THROMBIN (HEMOSTATIC MATRIX KIT) OPTIME
TOPICAL | Status: DC | PRN
  Administered 2023-01-30: 1 via TOPICAL

## 2023-01-30 MED ORDER — KETAMINE HCL 10 MG/ML IJ SOLN
INTRAMUSCULAR | Status: DC | PRN
  Administered 2023-01-30: 20 mg via INTRAVENOUS
  Administered 2023-01-30: 10 mg via INTRAVENOUS

## 2023-01-30 MED ORDER — CEFAZOLIN SODIUM-DEXTROSE 2-4 GM/100ML-% IV SOLN
2.0000 g | INTRAVENOUS | Status: AC
  Administered 2023-01-30: 2 g via INTRAVENOUS
  Filled 2023-01-30: qty 100

## 2023-01-30 MED ORDER — PROPOFOL 10 MG/ML IV BOLUS
INTRAVENOUS | Status: AC
  Filled 2023-01-30: qty 20

## 2023-01-30 MED ORDER — LIDOCAINE 2% (20 MG/ML) 5 ML SYRINGE
INTRAMUSCULAR | Status: AC
  Filled 2023-01-30: qty 5

## 2023-01-30 MED ORDER — ACETAMINOPHEN 10 MG/ML IV SOLN: 1000.0000 mg | Freq: Once | INTRAVENOUS | Status: DC | PRN

## 2023-01-30 MED ORDER — ONDANSETRON HCL 4 MG/2ML IJ SOLN
INTRAMUSCULAR | Status: DC | PRN
  Administered 2023-01-30: 4 mg via INTRAVENOUS

## 2023-01-30 MED ORDER — THROMBIN 20000 UNITS EX SOLR
CUTANEOUS | Status: AC
  Filled 2023-01-30: qty 20000

## 2023-01-30 MED ORDER — OXYCODONE HCL 5 MG/5ML PO SOLN: 5.0000 mg | Freq: Once | ORAL | Status: DC | PRN

## 2023-01-30 SURGICAL SUPPLY — 74 items
AGENT HMST KT MTR STRL THRMB (HEMOSTASIS) ×1
ANTIFOG SOL W/FOAM PAD STRL (MISCELLANEOUS) ×1
APL SKNCLS STERI-STRIP NONHPOA (GAUZE/BANDAGES/DRESSINGS) ×1
BAG COUNTER SPONGE SURGICOUNT (BAG) ×1 IMPLANT
BAG SPNG CNTER NS LX DISP (BAG) ×1
BENZOIN TINCTURE PRP APPL 2/3 (GAUZE/BANDAGES/DRESSINGS) ×1 IMPLANT
BIT DRILL NEURO 2X3.1 SFT TUCH (MISCELLANEOUS) ×1 IMPLANT
BIT DRILL SRG 14X2.2XFLT CHK (BIT) IMPLANT
BIT DRL SRG 14X2.2XFLT CHK (BIT) ×1
BLADE CLIPPER SURG (BLADE) ×1 IMPLANT
BLADE SURG 15 STRL LF DISP TIS (BLADE) ×1 IMPLANT
BLADE SURG 15 STRL SS (BLADE) ×1
BONE VIVIGEN FORMABLE 1.3CC (Bone Implant) ×2 IMPLANT
COVER SURGICAL LIGHT HANDLE (MISCELLANEOUS) ×1 IMPLANT
DEVICE ENDSKLTN IMPLANT SM 7MM (Cage) IMPLANT
DRAIN JACKSON RD 7FR 3/32 (WOUND CARE) IMPLANT
DRAPE C-ARM 42X72 X-RAY (DRAPES) ×1 IMPLANT
DRAPE POUCH INSTRU U-SHP 10X18 (DRAPES) ×1 IMPLANT
DRAPE SURG 17X23 STRL (DRAPES) ×3 IMPLANT
DRILL BIT SKYLINE 14MM (BIT) ×1
DRILL NEURO 2X3.1 SOFT TOUCH (MISCELLANEOUS) ×1
DURAPREP 26ML APPLICATOR (WOUND CARE) ×1 IMPLANT
ELECT COATED BLADE 2.86 ST (ELECTRODE) ×1 IMPLANT
ELECT REM PT RETURN 9FT ADLT (ELECTROSURGICAL) ×1
ELECTRODE REM PT RTRN 9FT ADLT (ELECTROSURGICAL) ×1 IMPLANT
ENDOSKELETON IMPLANT SM 7MM (Cage) ×3 IMPLANT
EVACUATOR SILICONE 100CC (DRAIN) IMPLANT
GAUZE 4X4 16PLY ~~LOC~~+RFID DBL (SPONGE) ×1 IMPLANT
GAUZE SPONGE 4X4 12PLY STRL (GAUZE/BANDAGES/DRESSINGS) ×1 IMPLANT
GLOVE BIO SURGEON STRL SZ 6.5 (GLOVE) ×1 IMPLANT
GLOVE BIO SURGEON STRL SZ8 (GLOVE) ×1 IMPLANT
GLOVE BIOGEL PI IND STRL 7.0 (GLOVE) ×2 IMPLANT
GLOVE BIOGEL PI IND STRL 8 (GLOVE) ×1 IMPLANT
GLOVE SURG ENC MOIS LTX SZ6.5 (GLOVE) ×1 IMPLANT
GOWN STRL REUS W/ TWL LRG LVL3 (GOWN DISPOSABLE) ×1 IMPLANT
GOWN STRL REUS W/ TWL XL LVL3 (GOWN DISPOSABLE) ×1 IMPLANT
GOWN STRL REUS W/TWL LRG LVL3 (GOWN DISPOSABLE) ×1
GOWN STRL REUS W/TWL XL LVL3 (GOWN DISPOSABLE) ×1
GRAFT BNE MATRIX VG FRMBL SM 1 (Bone Implant) IMPLANT
IV CATH 14GX2 1/4 (CATHETERS) ×1 IMPLANT
KIT BASIN OR (CUSTOM PROCEDURE TRAY) ×1 IMPLANT
KIT TURNOVER KIT B (KITS) ×1 IMPLANT
MANIFOLD NEPTUNE II (INSTRUMENTS) IMPLANT
NDL PRECISIONGLIDE 27X1.5 (NEEDLE) ×1 IMPLANT
NDL SPNL 20GX3.5 QUINCKE YW (NEEDLE) ×1 IMPLANT
NEEDLE PRECISIONGLIDE 27X1.5 (NEEDLE) ×1 IMPLANT
NEEDLE SPNL 20GX3.5 QUINCKE YW (NEEDLE) ×1 IMPLANT
NS IRRIG 1000ML POUR BTL (IV SOLUTION) ×1 IMPLANT
PACK ORTHO CERVICAL (CUSTOM PROCEDURE TRAY) ×1 IMPLANT
PAD ARMBOARD 7.5X6 YLW CONV (MISCELLANEOUS) ×2 IMPLANT
PATTIES SURGICAL .5 X.5 (GAUZE/BANDAGES/DRESSINGS) IMPLANT
PATTIES SURGICAL .5 X1 (DISPOSABLE) IMPLANT
PIN DISTRACTION 14 (PIN) IMPLANT
PLATE SKYLINE 3LVL 45MM CERV (Plate) IMPLANT
POSITIONER HEAD DONUT 9IN (MISCELLANEOUS) ×1 IMPLANT
SCREW SKYLINE VAR OS 14MM (Screw) IMPLANT
SOLUTION ANTFG W/FOAM PAD STRL (MISCELLANEOUS) IMPLANT
SPIKE FLUID TRANSFER (MISCELLANEOUS) ×1 IMPLANT
SPONGE INTESTINAL PEANUT (DISPOSABLE) ×2 IMPLANT
SPONGE SURGIFOAM ABS GEL 100 (HEMOSTASIS) ×1 IMPLANT
STRIP CLOSURE SKIN 1/2X4 (GAUZE/BANDAGES/DRESSINGS) ×1 IMPLANT
SURGIFLO W/THROMBIN 8M KIT (HEMOSTASIS) IMPLANT
SUT MNCRL AB 4-0 PS2 18 (SUTURE) ×1 IMPLANT
SUT VIC AB 2-0 CT2 18 VCP726D (SUTURE) ×1 IMPLANT
SYR BULB IRRIG 60ML STRL (SYRINGE) ×1 IMPLANT
SYR CONTROL 10ML LL (SYRINGE) ×2 IMPLANT
TAPE CLOTH 4X10 WHT NS (GAUZE/BANDAGES/DRESSINGS) ×1 IMPLANT
TAPE CLOTH SOFT 2X10 (GAUZE/BANDAGES/DRESSINGS) IMPLANT
TAPE UMBILICAL 1/8X30 (MISCELLANEOUS) ×2 IMPLANT
TOWEL GREEN STERILE (TOWEL DISPOSABLE) ×1 IMPLANT
TOWEL GREEN STERILE FF (TOWEL DISPOSABLE) ×1 IMPLANT
TRAY FOLEY MTR SLVR 16FR STAT (SET/KITS/TRAYS/PACK) ×1 IMPLANT
WATER STERILE IRR 1000ML POUR (IV SOLUTION) ×1 IMPLANT
YANKAUER SUCT BULB TIP NO VENT (SUCTIONS) ×1 IMPLANT

## 2023-01-30 NOTE — Op Note (Signed)
PATIENT NAME: BRAILEE RIEDE   MEDICAL RECORD NO.:   [id number]    DATE OF BIRTH: 10-10-1970   DATE OF PROCEDURE: 01/30/2023                               OPERATIVE REPORT     PREOPERATIVE DIAGNOSES: 1. Left-sided cervical radiculopathy (M54.12) 2. Cervical disc disorder with myelopathy, high cervical region, with spinal cord compression (M50.021, G95.29) 3. Spinal stenosis spanning C4-C7   POSTOPERATIVE DIAGNOSES: 1. Left-sided cervical radiculopathy (M54.12) 2. Cervical disc disorder with myelopathy, high cervical region, with spinal cord compression (M50.021, G95.29) 3. Spinal stenosis spanning C4-C7   PROCEDURE: 1. Anterior cervical decompression and fusion C4/5, C5/6, C6/7. 2. Placement of anterior instrumentation, C4-C7. 3. Insertion of interbody device x 3 (Titan intervertebral spacers). 4. Intraoperative use of fluoroscopy. 5. Use of morselized allograft - ViviGen.   SURGEON:  Estill Bamberg, MD   ASSISTANT:  Jason Coop, PA-C.   ANESTHESIA:  General endotracheal anesthesia.   COMPLICATIONS:  None.   DISPOSITION:  Stable.   ESTIMATED BLOOD LOSS:  Minimal.   INDICATIONS FOR SURGERY:  Briefly, Ms. Werntz is a pleasant 52 y.o. year- old female, who did present to me with severe pain in the neck and left arm.  The patient's MRI did reveal the findings noted above.  Given the patient's ongoing rather debilitating pain and lack of improvement with appropriate treatment measures, we did discuss proceeding with the procedure noted above.  The patient was fully aware of the risks and limitations of surgery as outlined in my preoperative note.   OPERATIVE DETAILS:  On 01/30/2023, the patient was brought to surgery and general endotracheal anesthesia was administered.  The patient was placed supine on the hospital bed. The neck was gently extended.  All bony prominences were meticulously padded.  The neck was prepped and draped in the usual sterile fashion.  At this  point, I did make a left-sided transverse incision.  The platysma was incised.  A Smith-Robinson approach was used and the anterior spine was identified. A self-retaining retractor was placed.  I then subperiosteally exposed the vertebral bodies from C4-C7.  Caspar pins were then placed into the C6 and C7 vertebral bodies and distraction was applied.  A thorough and complete C6-7 intervertebral diskectomy was performed.  The posterior longitudinal ligament was identified and entered using a nerve hook.  I then used #1 followed by #2 Kerrison to perform a thorough and complete intervertebral diskectomy.  The spinal canal was thoroughly decompressed, as was the right and left neuroforamen.  The endplates were then prepared and the appropriate-sized intervertebral spacer was then packed with ViviGen and tamped into position in the usual fashion.  The lower Caspar pin was then removed and placed into the C5 vertebral body and once again, distraction was applied across the C5-6 intervertebral space.  I then again performed a thorough and complete diskectomy, thoroughly decompressing the spinal canal and bilateral neuroforamena.  After preparing the endplates, the appropriate-sized intervertebral spacer was packed with ViviGen and tamped into position.  The lower Caspar pin was then removed and placed into the C4 vertebral body and once again, distraction was applied across the C4-5 intervertebral space.  I then again performed a thorough and complete diskectomy, thoroughly decompressing the spinal canal and bilateral neuroforamena.  After preparing the endplates, the appropriate-sized intervertebral spacer was packed with ViviGen and tamped into position.  The Caspar pins then  were removed and bone wax was placed in their place.  The appropriate-sized anterior cervical plate was placed over the anterior spine.  14 mm variable angle screws were placed, 2 in each vertebral body from C4-C7 for a  total of 8 vertebral body screws.  The screws were then locked to the plate using the Cam locking mechanism.  I was very pleased with the final fluoroscopic images.  The wound was then irrigated.  The wound was then explored for any undue bleeding and there was no bleeding noted. The wound was then closed in layers using 2-0 Vicryl, followed by 4-0 Monocryl.  Benzoin and Steri-Strips were applied, followed by sterile dressing.  All instrument counts were correct at the termination of the procedure.   Of note, Jason Coop, PA-C, was my assistant throughout surgery, and did aid in retraction, suctioning, placement of the hardware, and closure from start to finish.      Estill Bamberg, MD

## 2023-01-30 NOTE — H&P (Signed)
PREOPERATIVE H&P  Chief Complaint: Left arm pain  HPI: Suzanne Trujillo is a 52 y.o. 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 lap., lysis of adhesions, TAH, right salpingo-oophorectomy   BREAST BIOPSY  08/01/2010   right   BREAST LUMPECTOMY  05/30/2007   exc. 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   REDUCTION MAMMAPLASTY Bilateral    years ago    ROBOTIC ASSISTED LAPAROSCOPIC OVARIAN CYSTECTOMY Left 05/07/2017   Procedure: ROBOTIC ASSISTED LYSIS OF ADHESIONS AND LEFT SALPINGECTOMY OOPHORECTOMY;  Surgeon: Adolphus Birchwood, MD;  Location: [facility];  Service: Gynecology;  Laterality: Left;   TEMPOROMANDIBULAR JOINT SURGERY     x 2   Social History   Socioeconomic History   Marital status: Married    Spouse name: Not on file   Number of children: 2   Years of education: Not on file   Highest education level: Not on file  Occupational History   Not on file  Tobacco Use   Smoking status: Former    Packs/day: 0.25    Years:  30.00    Additional pack years: 0.00    Total pack years: 7.50    Types: Cigarettes    Quit date: 01/04/2021    Years since quitting: 2.0   Smokeless tobacco: Never  Vaping Use   Vaping Use: Every day  Substance and Sexual Activity   Alcohol use: Yes    Alcohol/week: 1.0 standard drink of alcohol    Types: 1 Glasses of wine per week   Drug use: No   Sexual activity: Not on file  Other Topics Concern   Not on file  Social History Narrative   Not on file   Social Determinants of Health   Financial Resource Strain: Not on file  Food Insecurity: Not on file  Transportation Needs: Not on file  Physical Activity: Not on file  Stress: Not on file  Social Connections: Not on file   History reviewed. No pertinent family history. No Known Allergies Prior to Admission medications   Medication Sig Start Date End Date Taking? Authorizing Provider  ALPRAZolam Prudy Feeler) 0.5 MG tablet Take 0.25-0.5 mg by mouth 3 (three) times daily as needed for anxiety.   Yes [provider]  cyclobenzaprine (FLEXERIL) 5 MG tablet Take 5 mg by mouth at bedtime as needed for muscle spasms.   Yes [provider]  meloxicam (MOBIC) 15 MG tablet Take 15 mg by  mouth daily.   Yes [provider]  methocarbamol (ROBAXIN) 500 MG tablet Take 500 mg by mouth every 6 (six) hours as needed for muscle spasms.   Yes [provider]  pregabalin (LYRICA) 75 MG capsule Take 75 mg by mouth 2 (two) times daily as needed (nerve pain). 01/11/23  Yes [provider]  sertraline (ZOLOFT) 100 MG tablet Take 100 mg by mouth daily. 01/01/23  Yes [provider]  traMADol (ULTRAM) 50 MG tablet Take 50-100 mg by mouth 3 (three) times daily as needed for moderate pain. 01/11/23  Yes [provider]     All other systems have been reviewed and were otherwise negative with the exception of those mentioned in the HPI and as above.  Physical Exam: Vitals:   01/30/23 0548  BP:  115/74  Pulse: 85  Resp: 18  Temp: 98.4 F (36.9 C)  SpO2: 98%    Body mass index is 26.61 kg/m.  General: Alert, no acute distress Cardiovascular: No pedal edema Respiratory: No cyanosis, no use of accessory musculature Skin: No lesions in the area of chief complaint Neurologic: Sensation intact distally Psychiatric: Patient is competent for consent with normal mood and affect Lymphatic: No axillary or cervical lymphadenopathy   Assessment/Plan: Spinal cord compression and neuroforaminal narrowing spanning C4-C7. Plan for Procedure(s): ANTERIOR CERVICAL DECOMPRESSION FUSION CERVICAL 4- CERVICAL 5, CERVICAL 5- CERVICAL 6, CERVICAL 6- CERVICAL 7 WITH INSTRUMENTATION AND ALLOGRAFT   Jackelyn Hoehn, MD 01/30/2023 7:39 AM

## 2023-01-30 NOTE — Transfer of Care (Signed)
Immediate Anesthesia Transfer of Care Note  Patient: Suzanne Trujillo  Procedure(s) Performed: ANTERIOR CERVICAL DECOMPRESSION FUSION CERVICAL 4- CERVICAL 5, CERVICAL 5- CERVICAL 6, CERVICAL 6- CERVICAL 7 WITH INSTRUMENTATION AND ALLOGRAFT (Spine Cervical)  Patient Location: PACU  Anesthesia Type:General  Level of Consciousness: awake, alert , oriented, and patient cooperative  Airway & Oxygen Therapy: Patient Spontanous Breathing and Patient connected to face mask oxygen  Post-op Assessment: Report given to RN and Post -op Vital signs reviewed and stable  Post vital signs: Reviewed and stable  Last Vitals:  Vitals Value Taken Time  BP 162/76 01/30/23 1048  Temp 36.8 C 01/30/23 1048  Pulse 98 01/30/23 1059  Resp 17 01/30/23 1059  SpO2 96 % 01/30/23 1059  Vitals shown include unvalidated device data.  Last Pain:  Vitals:   01/30/23 1048  TempSrc:   PainSc: 10-Worst pain ever      Patients Stated Pain Goal: 0 (01/30/23 0981)  Complications: No notable events documented.

## 2023-01-30 NOTE — Anesthesia Preprocedure Evaluation (Signed)
Anesthesia Evaluation  Patient identified by MRN, date of birth, ID band Patient awake    Reviewed: Allergy & Precautions, NPO status , Patient's Chart, lab work & pertinent test results  History of Anesthesia Complications Negative for: history of anesthetic complications  Airway Mallampati: III  TM Distance: >3 FB Neck ROM: Limited    Dental  (+) Teeth Intact, Dental Advisory Given   Pulmonary neg shortness of breath, neg sleep apnea, neg COPD, neg recent URI, former smoker   breath sounds clear to auscultation       Cardiovascular negative cardio ROS  Rhythm:Regular     Neuro/Psych  PSYCHIATRIC DISORDERS Anxiety      Neuromuscular disease    GI/Hepatic negative GI ROS, Neg liver ROS,,,  Endo/Other  negative endocrine ROS    Renal/GU negative Renal ROS     Musculoskeletal  (+) Arthritis ,    Abdominal   Peds  Hematology negative hematology ROS (+) Lab Results      Component                Value               Date                      WBC                      6.4                 01/22/2023                HGB                      13.7                01/22/2023                HCT                      41.0                01/22/2023                MCV                      89.7                01/22/2023                PLT                      344                 01/22/2023              Anesthesia Other Findings   Reproductive/Obstetrics                             Anesthesia Physical Anesthesia Plan  ASA: 2  Anesthesia Plan: General   Post-op Pain Management: Ofirmev IV (intra-op)* and Toradol IV (intra-op)*   Induction: Intravenous  PONV Risk Score and Plan: 3 and Ondansetron, Dexamethasone, Propofol infusion and Midazolam  Airway Management Planned: Oral ETT and Video Laryngoscope Planned  Additional Equipment: None  Intra-op Plan:   Post-operative Plan: Extubation in  OR  Informed Consent: I have reviewed the  patients History and Physical, chart, labs and discussed the procedure including the risks, benefits and alternatives for the proposed anesthesia with the patient or authorized representative who has indicated his/her understanding and acceptance.       Plan Discussed with: CRNA  Anesthesia Plan Comments:        Anesthesia Quick Evaluation

## 2023-01-30 NOTE — Anesthesia Procedure Notes (Signed)
Procedure Name: Intubation Date/Time: 01/30/2023 7:56 AM  Performed by: Shary Decamp, CRNAPre-anesthesia Checklist: Patient identified, Patient being monitored, Timeout performed, Emergency Drugs available and Suction available Patient Re-evaluated:Patient Re-evaluated prior to induction Oxygen Delivery Method: Circle system utilized Preoxygenation: Pre-oxygenation with 100% oxygen Induction Type: IV induction Ventilation: Mask ventilation without difficulty and Oral airway inserted - appropriate to patient size Laryngoscope Size: Mac, 3 and Glidescope Grade View: Grade I Tube type: Oral Tube size: 7.0 mm Number of attempts: 1 Airway Equipment and Method: Rigid stylet and Video-laryngoscopy Placement Confirmation: ETT inserted through vocal cords under direct vision, positive ETCO2 and breath sounds checked- equal and bilateral Secured at: 21 cm Tube secured with: Tape Dental Injury: Teeth and Oropharynx as per pre-operative assessment  Difficulty Due To: Difficult Airway- due to limited oral opening

## 2023-02-05 ENCOUNTER — Encounter (HOSPITAL_COMMUNITY): Payer: Self-pay | Admitting: Orthopedic Surgery

## 2023-02-05 NOTE — Anesthesia Postprocedure Evaluation (Signed)
Anesthesia Post Note  Patient: Suzanne Trujillo  Procedure(s) Performed: ANTERIOR CERVICAL DECOMPRESSION FUSION CERVICAL 4- CERVICAL 5, CERVICAL 5- CERVICAL 6, CERVICAL 6- CERVICAL 7 WITH INSTRUMENTATION AND ALLOGRAFT (Spine Cervical)     Patient location during evaluation: PACU Anesthesia Type: General Level of consciousness: awake and alert Pain management: pain level controlled Vital Signs Assessment: post-procedure vital signs reviewed and stable Respiratory status: spontaneous breathing, nonlabored ventilation, respiratory function stable and patient connected to nasal cannula oxygen Cardiovascular status: blood pressure returned to baseline and stable Postop Assessment: no apparent nausea or vomiting Anesthetic complications: no   No notable events documented.  Last Vitals:  Vitals:   01/30/23 1130 01/30/23 1145  BP: 139/76 137/82  Pulse: 89 94  Resp: 12 13  Temp:  36.8 C  SpO2: 93% 92%    Last Pain:  Vitals:   01/30/23 1145  TempSrc:   PainSc: 4                  Liseth Wann

## 2023-03-08 IMAGING — MG DIGITAL SCREENING BILAT W/ CAD
5 series · 5 of 5 positions shown · non-contrast
Comparison: Previous exam(s).

CLINICAL DATA: Screening.

EXAM:
DIGITAL SCREENING BILATERAL MAMMOGRAM WITH CAD
TECHNIQUE: Bilateral screening digital craniocaudal and mediolateral oblique
mammograms were obtained. The images were evaluated with
computer-aided detection.

[L CC (1 of 2)]
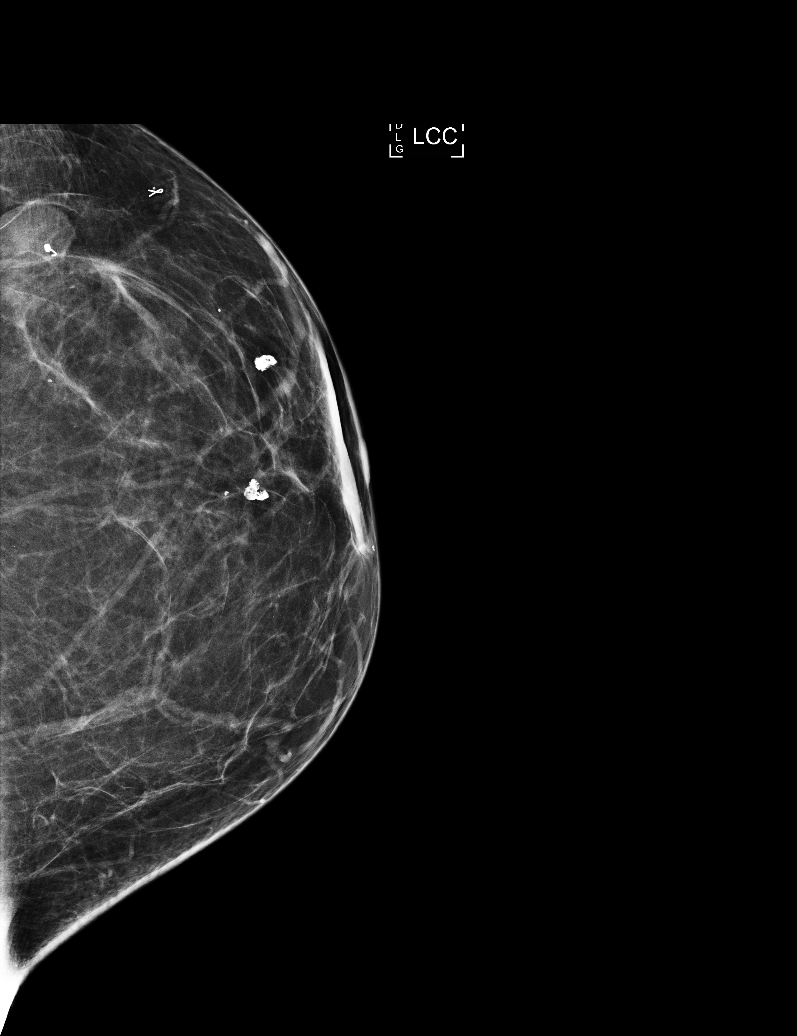

[R MLO]
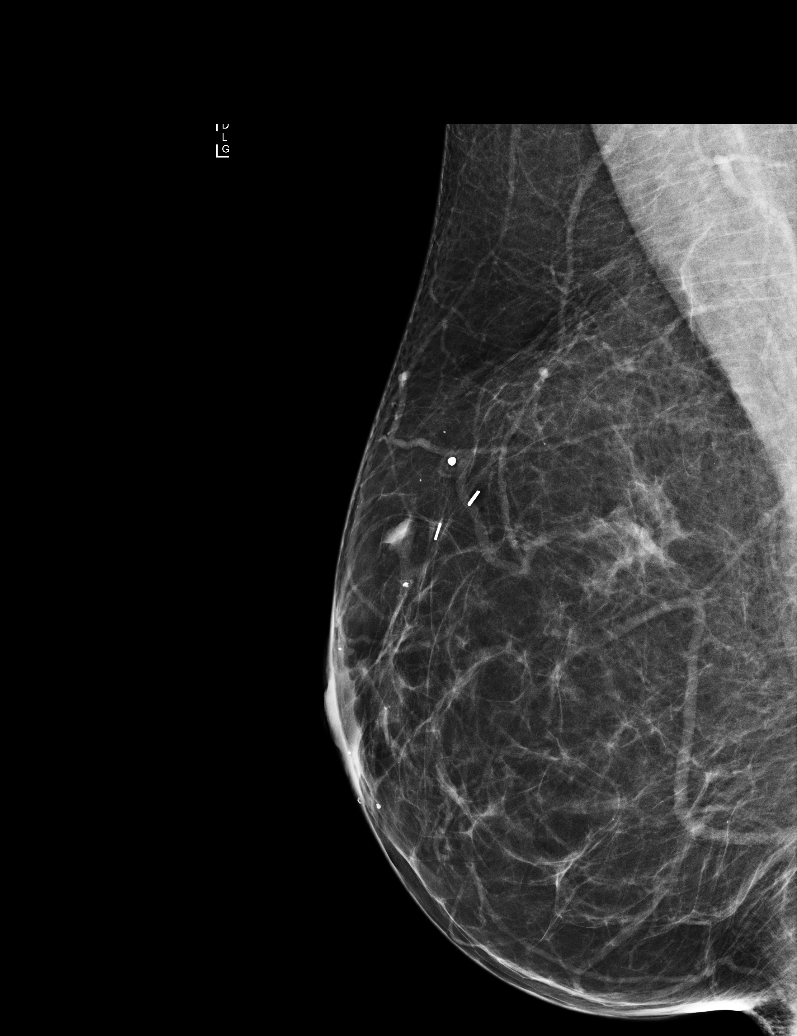

[L MLO]
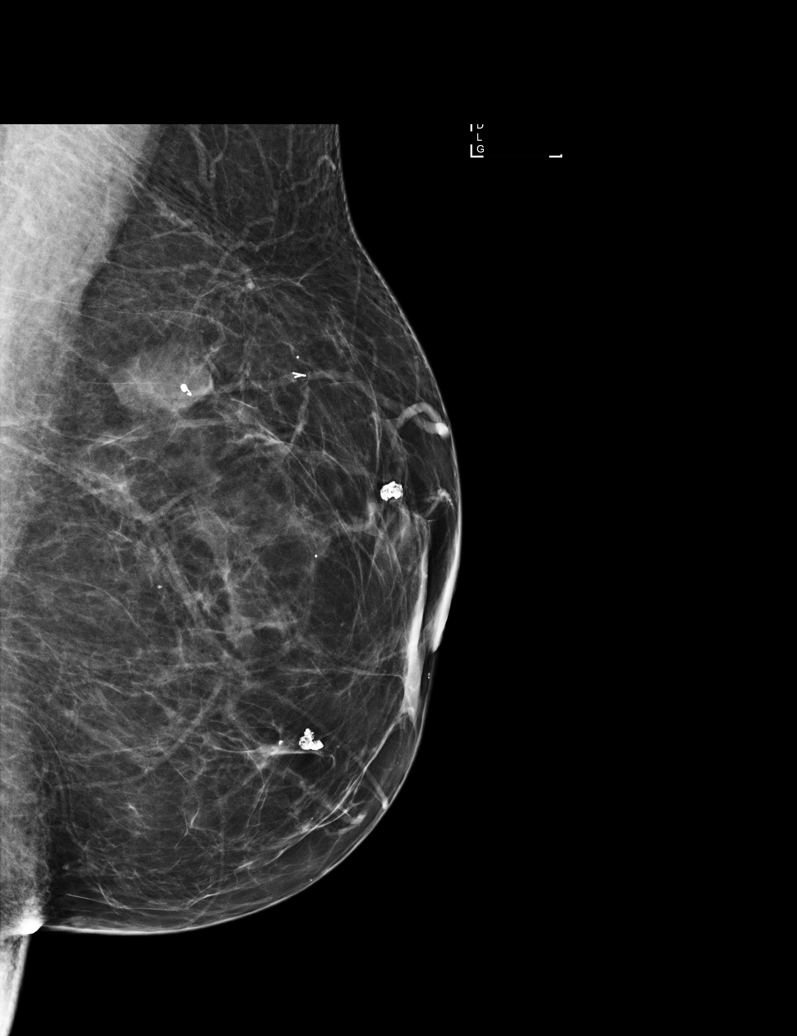

[L CC (2 of 2)]
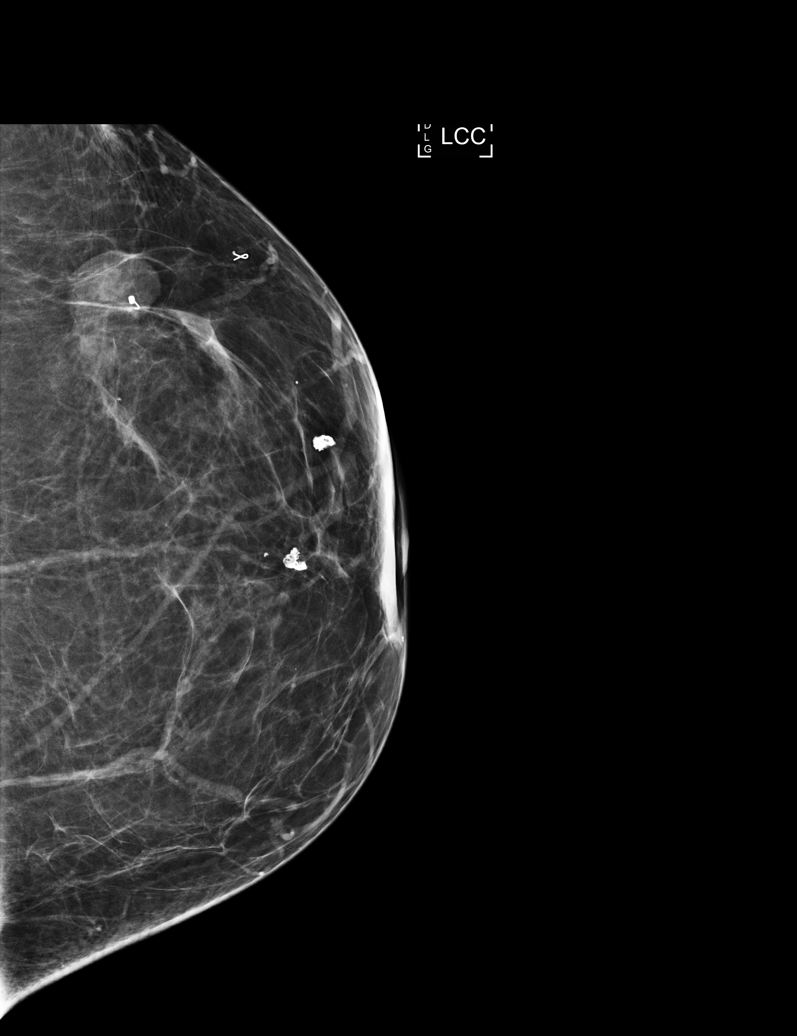

[R CC]
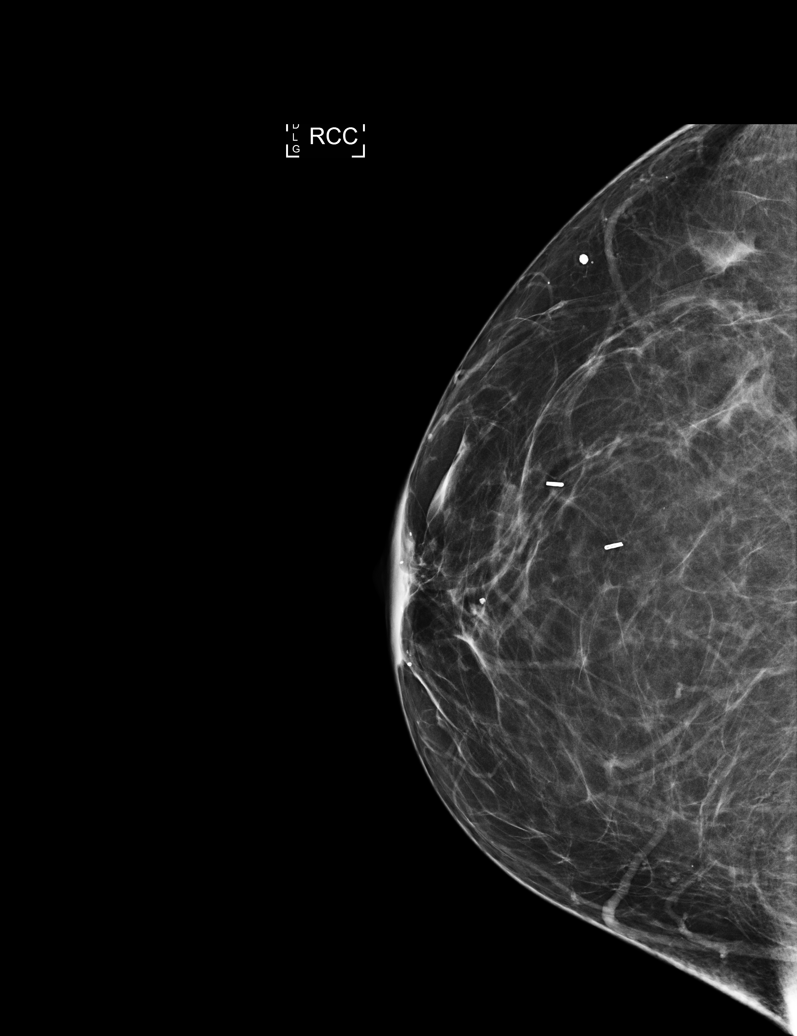

[5 of 5 positions shown; findings below may reference images not displayed]

ACR Breast Density Category b: There are scattered areas of
fibroglandular density.
FINDINGS: There are no findings suspicious for malignancy. The images were
evaluated with computer-aided detection.
IMPRESSION: No mammographic evidence of malignancy. A result letter of this
screening mammogram will be mailed directly to the patient.

RECOMMENDATION:
Screening mammogram in one year. (Code:[id number]-[id number])

BI-RADS CATEGORY  1: Negative.

## 2023-06-27 ENCOUNTER — Other Ambulatory Visit: Payer: Self-pay | Admitting: Family Medicine

## 2023-06-27 DIAGNOSIS — Z1231 Encounter for screening mammogram for malignant neoplasm of breast: Secondary | ICD-10-CM

## 2023-08-13 ENCOUNTER — Ambulatory Visit
Admission: RE | Admit: 2023-08-13 | Discharge: 2023-08-13 | Disposition: A | Discharge status: Home / Self Care | Payer: Commercial Managed Care - PPO | Source: Ambulatory Visit | Attending: Family Medicine | Admitting: Family Medicine

## 2023-08-13 DIAGNOSIS — Z1231 Encounter for screening mammogram for malignant neoplasm of breast: Secondary | ICD-10-CM
